# Patient Record
Sex: Female | Born: 1940 | Race: White | Hispanic: No
Health system: Midwestern US, Academic
[De-identification: ages and names within clinical notes are randomized; demographics above are authoritative.]

---

## 2017-12-30 ENCOUNTER — Encounter: Admit: 2017-12-30 | Discharge: 2017-12-31 | Payer: MEDICARE

## 2018-07-30 ENCOUNTER — Encounter: Admit: 2018-07-30 | Discharge: 2018-07-30 | Payer: MEDICARE

## 2018-07-30 DIAGNOSIS — R4189 Other symptoms and signs involving cognitive functions and awareness: ICD-10-CM

## 2018-07-30 DIAGNOSIS — R413 Other amnesia: Principal | ICD-10-CM

## 2018-08-02 ENCOUNTER — Encounter: Admit: 2018-08-02 | Discharge: 2018-08-02 | Payer: MEDICARE

## 2018-08-02 ENCOUNTER — Ambulatory Visit: Admit: 2018-08-02 | Discharge: 2018-08-03

## 2018-08-02 DIAGNOSIS — R4189 Other symptoms and signs involving cognitive functions and awareness: ICD-10-CM

## 2018-08-02 DIAGNOSIS — I6381 Other cerebral infarction due to occlusion or stenosis of small artery: ICD-10-CM

## 2018-08-02 DIAGNOSIS — E7800 Pure hypercholesterolemia, unspecified: ICD-10-CM

## 2018-08-02 DIAGNOSIS — R413 Other amnesia: Principal | ICD-10-CM

## 2018-08-02 DIAGNOSIS — R930 Abnormal findings on diagnostic imaging of skull and head, not elsewhere classified: ICD-10-CM

## 2018-08-02 DIAGNOSIS — G301 Alzheimer's disease with late onset: Principal | ICD-10-CM

## 2018-08-03 ENCOUNTER — Encounter: Admit: 2018-08-03 | Discharge: 2018-08-03 | Payer: MEDICARE

## 2018-08-16 ENCOUNTER — Encounter: Admit: 2018-08-16 | Discharge: 2018-08-16 | Payer: MEDICARE

## 2018-11-16 ENCOUNTER — Encounter: Admit: 2018-11-16 | Discharge: 2018-11-16 | Payer: MEDICARE

## 2018-11-16 MED ORDER — MEMANTINE 10 MG PO TAB
10 mg | ORAL_TABLET | Freq: Two times a day (BID) | ORAL | 1 refills | Status: AC
Start: 2018-11-16 — End: 2019-04-01

## 2019-01-27 ENCOUNTER — Encounter: Admit: 2019-01-27 | Discharge: 2019-01-27 | Payer: MEDICARE

## 2019-01-31 ENCOUNTER — Encounter: Admit: 2019-01-31 | Discharge: 2019-01-31 | Payer: MEDICARE

## 2019-02-02 ENCOUNTER — Encounter: Admit: 2019-02-02 | Discharge: 2019-02-02 | Payer: MEDICARE

## 2019-02-09 ENCOUNTER — Encounter: Admit: 2019-02-09 | Discharge: 2019-02-09 | Payer: MEDICARE

## 2019-02-09 MED ORDER — DONEPEZIL 10 MG PO TAB
10 mg | ORAL_TABLET | Freq: Every evening | ORAL | 1 refills | 90.00000 days | Status: DC
Start: 2019-02-09 — End: 2019-08-03

## 2019-04-01 ENCOUNTER — Encounter: Admit: 2019-04-01 | Discharge: 2019-04-01

## 2019-04-01 MED ORDER — MEMANTINE 10 MG PO TAB
10 mg | ORAL_TABLET | Freq: Two times a day (BID) | ORAL | 0 refills | Status: DC
Start: 2019-04-01 — End: 2019-07-05

## 2019-04-01 NOTE — Telephone Encounter
Patient to continue memantine 10 mg Bid per last office visit on 08/12/18.  Last Rx 11/16/18 #180 with 1 refill.

## 2019-04-06 ENCOUNTER — Encounter: Admit: 2019-04-06 | Discharge: 2019-04-06

## 2019-04-06 ENCOUNTER — Encounter: Admit: 2019-04-06 | Discharge: 2019-04-07

## 2019-04-07 ENCOUNTER — Encounter: Admit: 2019-04-07 | Discharge: 2019-04-07

## 2019-04-13 ENCOUNTER — Encounter: Admit: 2019-04-13 | Discharge: 2019-04-13

## 2019-04-18 ENCOUNTER — Encounter: Admit: 2019-04-18 | Discharge: 2019-04-18

## 2019-04-18 ENCOUNTER — Ambulatory Visit: Admit: 2019-04-18 | Discharge: 2019-04-18

## 2019-04-18 DIAGNOSIS — R4189 Other symptoms and signs involving cognitive functions and awareness: Secondary | ICD-10-CM

## 2019-04-18 DIAGNOSIS — R413 Other amnesia: Secondary | ICD-10-CM

## 2019-04-18 DIAGNOSIS — E7800 Pure hypercholesterolemia, unspecified: Secondary | ICD-10-CM

## 2019-04-18 DIAGNOSIS — R51 Headache: Secondary | ICD-10-CM

## 2019-04-18 DIAGNOSIS — R9082 White matter disease, unspecified: Secondary | ICD-10-CM

## 2019-04-18 DIAGNOSIS — I6381 Other cerebral infarction due to occlusion or stenosis of small artery: Secondary | ICD-10-CM

## 2019-04-18 DIAGNOSIS — G301 Alzheimer's disease with late onset: Secondary | ICD-10-CM

## 2019-04-18 NOTE — Progress Notes
MEMORY CARE CLINIC EVALUATION    Obtained patient's verbal consent to treat them and their agreement to [facility] financial policy and NPP via this telehealth visit during the [facility]    This was a video visit    Referring Provider:    Tildon Husky, PA-C  [address]  [place], [place] [address]    Chief Complaint:  Sabrina Dillon is a 78 y.o. old female here for return evaluation.  History provided by: 2 daughters and patient  Onset: Roughly 2018  Course: Gradual but possible subacute change with the stroke in May    _______________________________________________________________________  _____________________HISTORY__________________________________________  Per Sabrina Dillon - everything changed mostly around the stroke in March. She had imbalance and confusion - could not do chores around her house. It took her about 3 days of this to notify her daughters and they took her to the local ED. 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 She had mild left sided weakness/incoordination that improved with physical therapy.  ???  They reportedly did an echocardiogram and carotid ultrasound. 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 Her LDL was elevated and this was thought to be a possible cause - she was placed on plavix + aspirin for 90 days and atorvastatin.  ???  Overall, she thinks she is back to baseline.  ???  Per her daughters, memory has been an issue for 2-3 years. She was seen by a neurology group in [place]. Sabrina Dillon who suggested that this was early Alzheimer's back around 1 year ago.      INTERVAL HISTORY 04/18/19    She has been relatively stable    Two weeks ago: left face drooping, slurring words, confused with conversations. They brought Sabrina Dillon ED - CT and MRI, they did not see any new strokes. Her symptoms resolved within about 3.5-4 years. Her blood pressure was mildly elevated and then resolved. Then she would fluctuate - and it eventually resolved and they were discharged from the ED with no further medication changes or workup ordered.    She is waking up in the morning with pressure in her head and with headaches.    Memory:  No major changes here. She is getting a little bit more assistance from family at this time.    Motor:  No falls - uses a cane as an extra stabilizer      Cognitive and Functional History (i.e. memory, orientation, judgment, home and hobbies, community affairs, bathing / grooming):    Living arrangements: She lives alone in her house and nearby   Dressing: Independent  Bathing: Independent  Toileting: Independent  Eating:  Independent  Using a phone or land line: Independent  Usual household chores:  Independent  Meal Preparation:  Requires assistance - senior citizens deliver meals and she can cook   Medication administration: Requires assistance - granddaughter/daughter sets up the pills in a pill box and she takes them on her own.  Managing finances: Requires assistance - granddaughter.     No longer driving.  ???  Behavioral and Neuropsychiatric History (i.e., depression, agitation, behavior / personality): PHQ-2 Score: 2     No major concerns.  ???  ???  Other ROS  Visual hallucinations: no    Parkinsonism / tremors: no    Gait Changes: no  Anosmia: no  Orthostatic sx: no  Vision problems: no  Wt loss: no  Sleep issues: no   Incontinence:no  A comprehensive 14 point review of systems was negative unless otherwise mentioned in the HPI.  ???  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 Denies heavy alcohol and illicit substance use.  ???  Family hx:  No known family h/o dementia or similar sx's.  ???  Caregiver Burden and Social Support:  Good social support.  ???  Safety Assessment (driving, falls, etc):  Not sure on falls.   ???  Advanced Care Planning  DPOA: Yes  Advance Directive???????Yes ____________________________________________________________________  ____________________MEDICATIONS_____________________________________  ??? aspirin 81 mg chewable tablet Chew 81 mg by mouth daily. Take with food.   ??? atorvastatin (LIPITOR) 40 mg tablet Take 40 mg by mouth daily.   ??? donepeziL (ARICEPT) 10 mg tablet Take one tablet by mouth at bedtime daily.   ??? escitalopram oxalate (LEXAPRO) 20 mg tablet Take 20 mg by mouth daily.   ??? memantine (NAMENDA) 10 mg tablet Take one tablet by mouth twice daily.       High Risk Medication Review:     Relevant Medical History / Contributing Factors to Cognition:  None  ______________________________________________________________________________  ___________________PHYSICAL AND COGNITIVE EXAM______________________  Vital Signs: Pulse 84 Comment: Per daughter/Sabrina Dillon - Wt 57.6 kg (127 lb) Comment: Patient recalls - BMI 21.80 kg/m???     This was a phone visit and no exam was performed.    Labs / Imaging  No results found for: TSH  No results found for: VITB12  No results found for this or any previous visit.    __________________________________________________________________  _______________DIAGNOSIS AND PLAN_______________________________    DIAGNOSIS:      ICD-9-CM ICD-10-CM    1. Late onset Alzheimer's disease without behavioral disturbance (HCC) 331.0 G30.1     294.10 F02.80    2. 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 White matter abnormality on MRI of brain 793.0 R90.82    4. Hypercholesterolemia 272.0 E78.00    5. Morning headache 784.0 R51         Stage: mild dementia   CDR = 1.0       Fortunately, overall Sabrina Dillon appears stable per the history. As noted previously she has mild dementia with the probable etiology due to early Alzheimer's disease + vascular disease contribution. The stability is short term at this point but could indicate more of a vascular component. There are three possibilities for the event that occurred 2 weeks ago:    1) An amyloid spell: where blood vessels that are damaged from amyloid angiopathy leak blood out which is irritating to the brain tissue and can cause transient neurologic symptoms. As described earlier an MRI with special SWI/SWAN sequences would be ideal to look at this but one with GRE sequences can sometimes suffice and they may have done that on this MRI. We will have it requested to upload to our system so this can be reviewed.    2) A TIA - transient ischemic attack - a new near stroke that was basically what we refer to as a stuttering lacune - a term for a small lacunar stroke that fluctuates due to the blood vessel being very tenuous. Her prior stroke was similar in size/location to what we see in lacunar strokes. These types are usually due to the vascular risk factors: aging and genetics but more importantly ones we can control: smoking, high blood  pressure, high cholesterol, diabetes, sleep apnea, and migraines. Tight blood pressure control < 120 mmHg systolic is recommended going forwards.    3) Her prior stroke was on that right side and it could have flared up again or even had a mini seizure from that old damaged brain tissue - I think this one is less likely but if the MRI truly does not have any new lesions this remains a possibility.    To look into this incident further I would like to:    1) Get access and review her MRI scan from 2 weeks ago  2) Request her PCP order a new carotid ultrasound and have the report sent to me  3) Request her PCP order a 24-hour blood pressure cuff. This is partly to look into whether her blood pressure is spiking at night time. She wakes up with headaches more frequently now and two potential causes are sleep apnea and/or elevated blood pressure at night time. This test may help [place] adjust her BP mediations for the blood pressure goals suggested above. The carotid ultrasound should probably be rechecked given the previous TIA.    24-hour blood pressure       I spent a total of 30 minutes with the patient;  25 minutes were spent in counseling and coordination of care as noted above.      Medical decision making: moderate to severe    How to Keep Your Brain Healthy   1. Physical exercise: Physical exercise allows more blood flow to get to your brain.  All exercise is better than none, but gardening and walking are like tier 1 of exercise and will not get your heart rate higher than 100 in many instances. Weight training, yoga, and resistance bands are like tier 2 level. We see the real benefits at tier 3 level of exercise, which involves aerobic or cardiovascular exercise. Cardio exercise involves getting your heart rate up and a real sweat going.  This level of exercise can release BDNF (brain derived neurotrophic factor) which helps stabilize neurons - we think of it as fertilizer for those brain cells.  Research studies suggest symptoms progress slower and brain atrophy slows in cardio intensive exercise groups.  This can be done with high intensity workouts, running, biking, stationary biking, rowing machine, or in a swimming pool.  Talk to your health care provider before doing strenuous exercise. The ultimate goal is to get 150 minutes of moderate to intense exercise each week, most easily divided up into 30 minutes per day 5 days per week. It is recommended that one eases into this and slowly increases their amount of exercise over weeks to months. If you have never exercised before or are in poor exercise shape, it is recommended that you seek a personal trainer or join a local gym with programs directed for your age/exercise level.    2. Diet: Eating a heart-healthy diet may help protect brain function. Further information about the Mediterranean/MIND diet is listed below.  Talk to your primary medical provider about your specific diet needs. 3. Sleep: The toxic proteins that we all build up during the day and are associated with neurodegeneration of neurons are likely only cleared during deep, slow wave stages of sleep. If deep sleep is disrupted, fewer proteins are cleared. Sleep is also important for memory consolidation and restorative aspects to help with attention of tasks throughout the next day.  The average person requires 7-9 hours of quality sleep per night.  Try  to maintain a sleep routine.  Avoid caffeine in the afternoon and heavy meals after 7 pm.  Talk to your local medical provider if you are having problems with sleeping (for example: insomnia, obstructive sleep apnea, excessive daytime sleepiness, or not sleeping enough).    4. Mental exercise: Push your brain with new things and new places.  Take a community education class, or attend a lecture at [employer]. Doing activities, such as reading, learning an instrument or a new language is healthy for our brains. Utilize cognitive training apps or programs. There is no evidence currently that one works better than other, but make sure not to focus on one thing (crosswords, Sudoku puzzles, computer-based games, etc.)  Make sure to have a broad cognitive program.  5. Stress Reduction: Simplify your life as much as possible. Meditate, do yoga or tai chi, or perform deep breathing, etc.  Stress will amplify any underlying problem.    6. Spiritual Fitness: Find something you are passionate about and that gives your life meaning and purpose each day.  Get involved.  Stay active in your local community and with social functions.    Mediterranean Diet   The Mediterranean Diet and the MIND diet are the best studied across neurodegenerative diseases.  Both have shown a reduced risk of Alzheimer's disease across multiple studies, ranging from 35-50% reduced risk.  The Mediterranean diet is considered to be a heart-healthy diet as well. Interested in trying the Mediterranean diet? These tips will help you get started:  ? Eat more fruits and vegetables. Aim for 7 to 10 servings a day of fruit and vegetables.  ? Opt for whole grains. Switch to whole-grain bread, cereal and pasta. Experiment with other whole grains, such as bulgur and farro.  ? Use healthy fats. Try olive oil as a replacement for butter when cooking. Instead of putting butter or margarine on bread, try dipping it in flavored olive oil.  ? Eat more seafood. Eat fish twice a week. Fresh or water-packed tuna, salmon, trout, mackerel and herring are healthy choices. Grilled fish tastes good and requires little cleanup. Avoid deep-fried fish.  ? Reduce red meat. Substitute fish, poultry or beans for meat. If you eat meat, make sure it's lean and keep portions small.  ? Enjoy some dairy. Eat low-fat [place] or plain yogurt and small amounts of a variety of cheeses.  ? Spice it up. Herbs and spices boost flavor and lessen the need for salt.    Utilize a high rated book on the subject or a good S[employer]itor as well: https://www.helpguide.org/articles/diets/the-mediterranean-diet.htm

## 2019-04-19 ENCOUNTER — Encounter: Admit: 2019-04-19 | Discharge: 2019-04-19

## 2019-04-19 DIAGNOSIS — R4189 Other symptoms and signs involving cognitive functions and awareness: Secondary | ICD-10-CM

## 2019-04-19 DIAGNOSIS — R413 Other amnesia: Secondary | ICD-10-CM

## 2019-05-18 ENCOUNTER — Encounter: Admit: 2019-05-18 | Discharge: 2019-05-18

## 2019-05-18 NOTE — Telephone Encounter
RN sent request over for MRI and CT images done at [facility]. To be clouded to [employer] and reports to be faxed to [phone].

## 2019-05-18 NOTE — Telephone Encounter
-----   Message from Leighton Ruff, MD sent at 04/19/2019  7:51 AM CDT -----  Patient had an MRI and CT done 2 weeks ago at [facility] emergency room.    Can we please request that via coud and I get notified when it is available to review?    Thank you,    Thurmond Butts

## 2019-05-19 ENCOUNTER — Encounter: Admit: 2019-05-19 | Discharge: 2019-05-19

## 2019-05-19 NOTE — Telephone Encounter
E-mail sent to [facility] asking that they request the patient's MRI head images done in the past month to be clouded from [facility].

## 2019-05-20 ENCOUNTER — Encounter: Admit: 2019-05-20 | Discharge: 2019-05-20

## 2019-05-20 NOTE — Telephone Encounter
Received notification from [facility] that they don't have any records for the patient.  Images were re-requested from [facility] in [place], [place].  CT head w/o and MRI brain w/o reports and images from 04/06/19 were received.

## 2019-05-24 IMAGING — CT SINUS_(Adult)
3 of 4 series · 15 of 47 positions shown, 18 images · non-contrast
Comparison: none

[Series 2: sinus ax 3.00 hr60 s3 · axial · 0.29mm/px · z∈[-523,-416]mm · 9 of 43 slices shown, 12 images]
[im 4/43  brain]
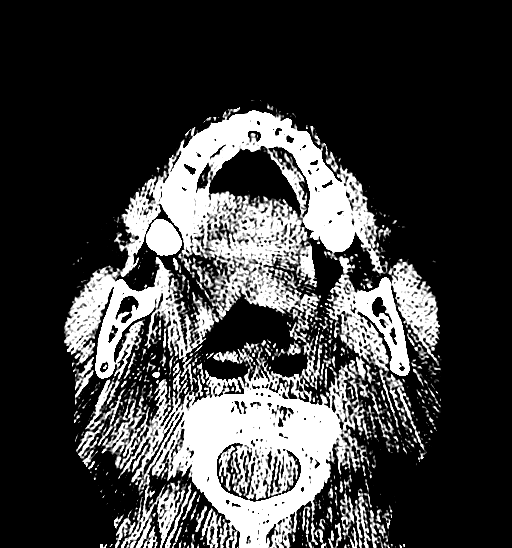
[im 4/43  bone]
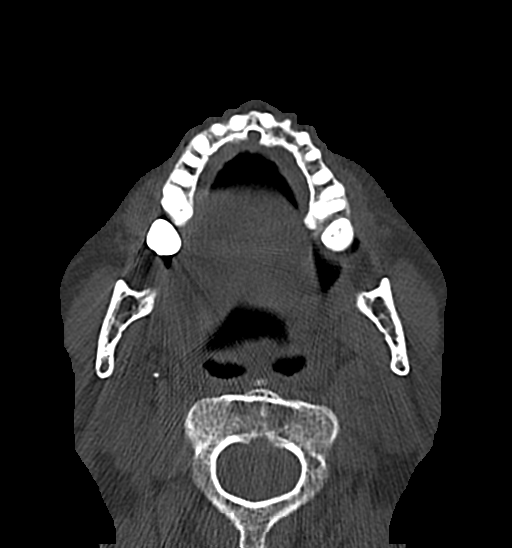
[im 10/43  bone]
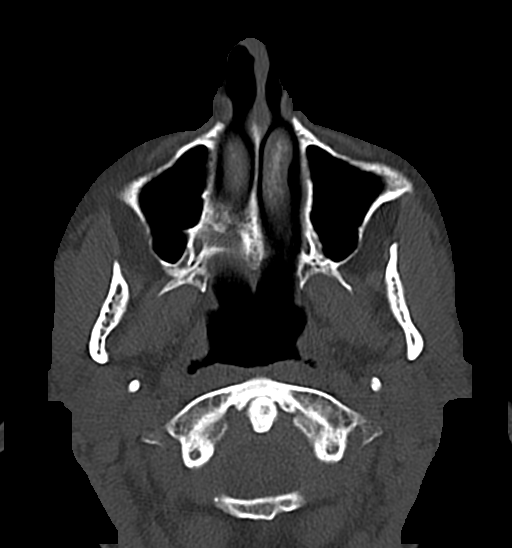
[im 13/43  bone]
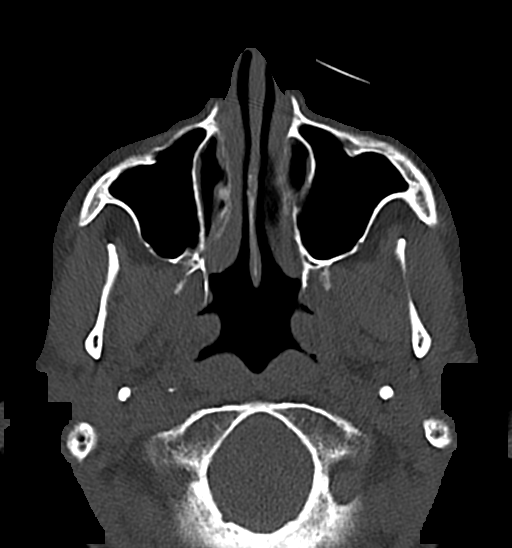
[im 19/43  bone]
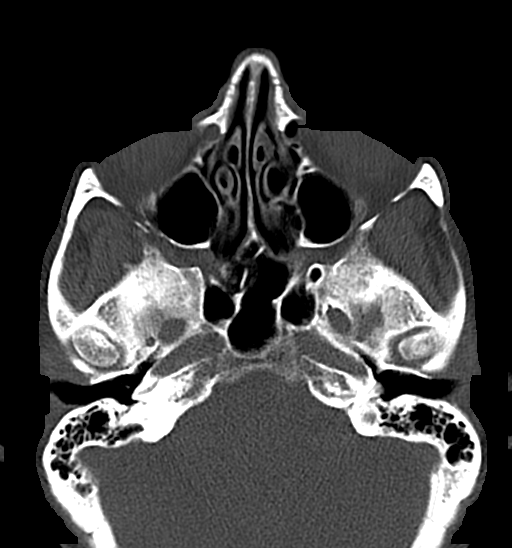
[im 22/43  brain]
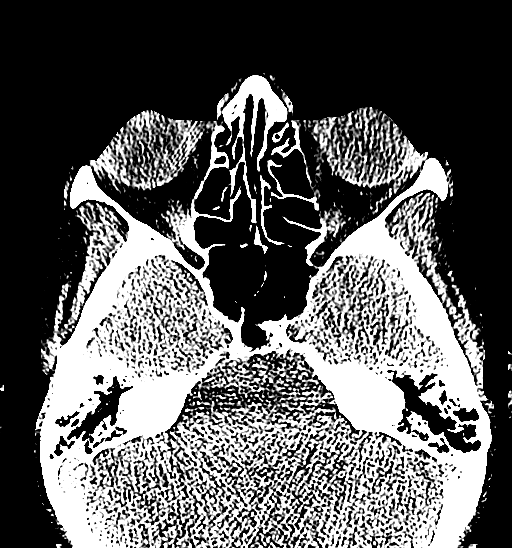
[im 22/43  bone]
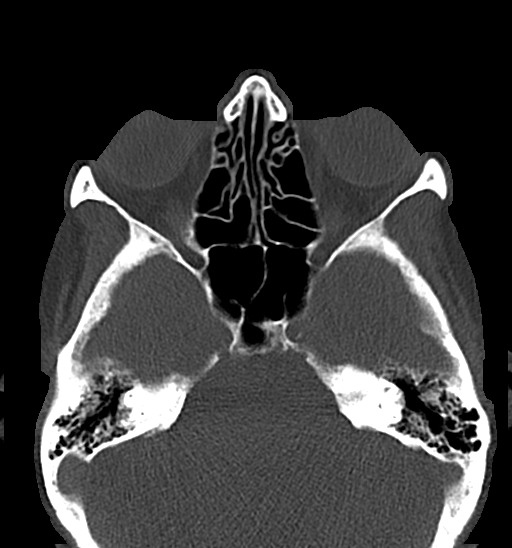
[im 25/43  bone]
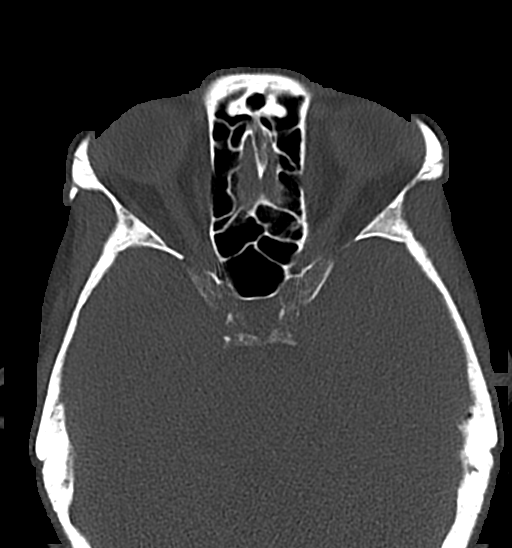
[im 31/43  bone]
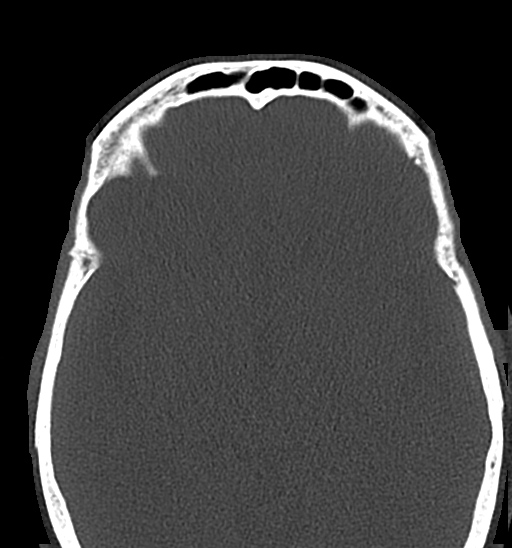
[im 34/43  bone]
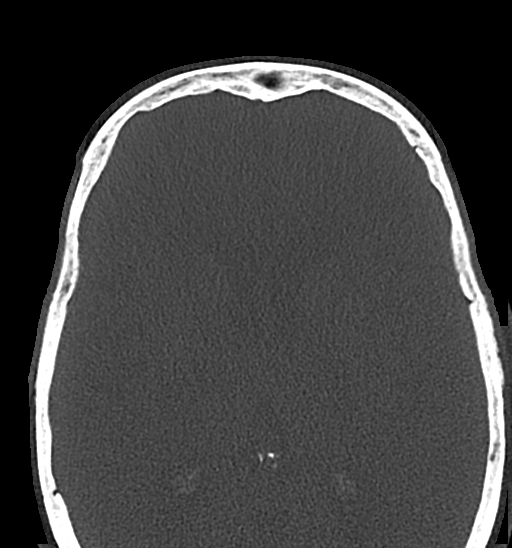
[im 40/43  brain]
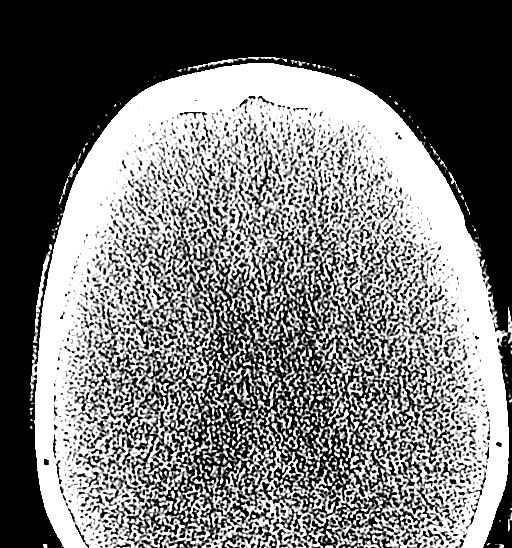
[im 40/43  bone]
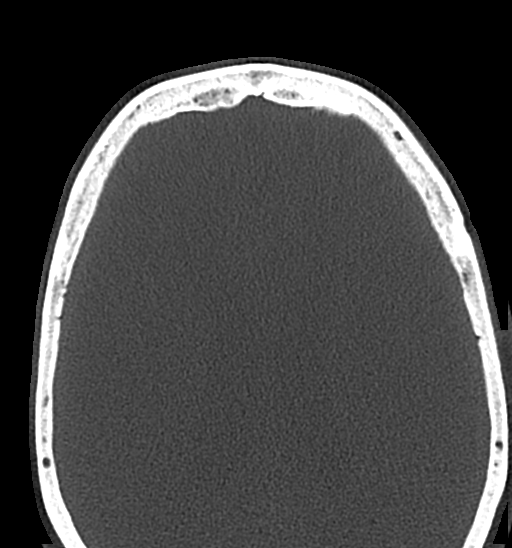

[Series 4: sinus cor 3.00 hr60 s3 · coronal · 0.26mm/px · 3 of 52 slices shown]
[im 18/52  bone]
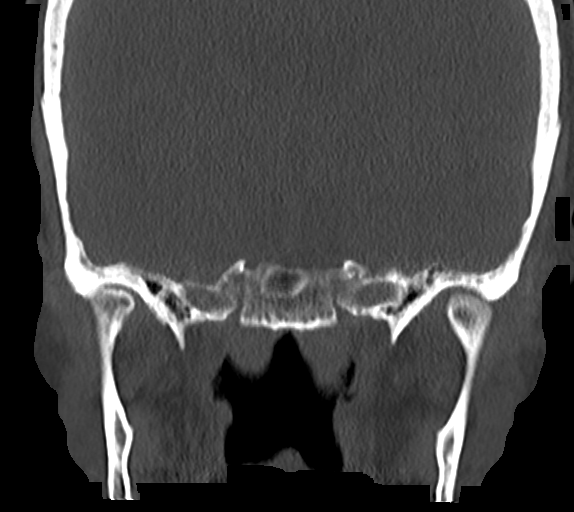
[im 23/52  bone]
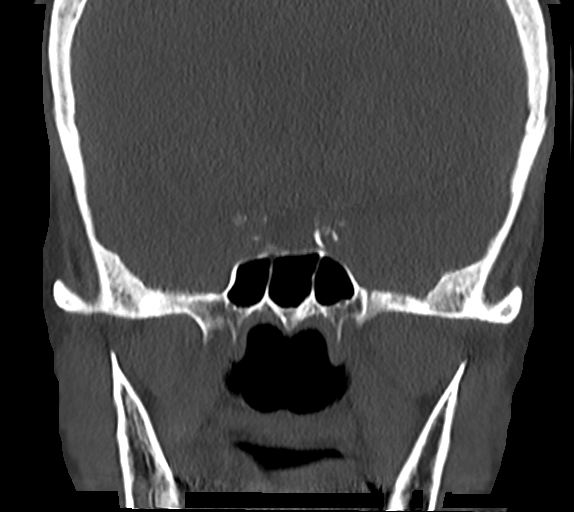
[im 29/52  bone]
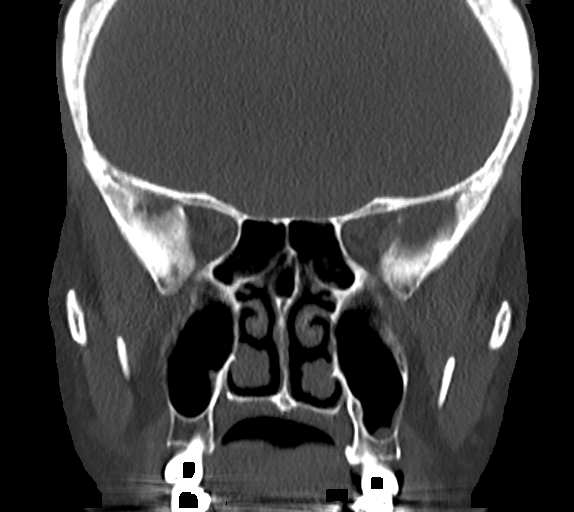

[Series 6: sinus sag 3.00 hr60 s3 · sagittal · 0.26mm/px · 3 of 49 slices shown]
[im 17/49  bone]
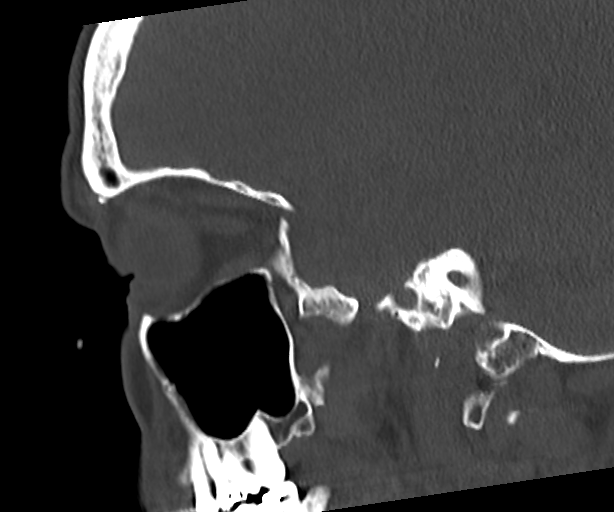
[im 25/49  bone]
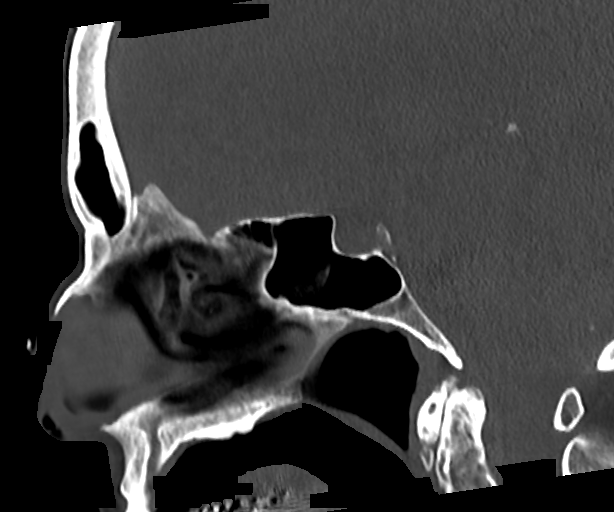
[im 33/49  bone]
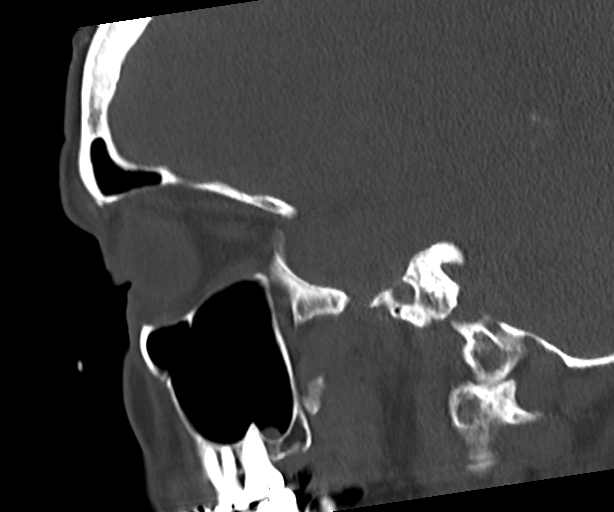

[15 of 47 positions shown; findings below may reference images not displayed]

DIAGNOSTIC STUDIES

EXAM
CT PARANASAL SINUSES WITHOUT CONTRAST

INDICATION
chronic recurrent sinusitis
chronic recurrent sinusitis

TECHNIQUE
Contiguous transaxial imaging through the paranasal sinuses was performed in the absence of
intravenous iodinated contrast material. Coronal and sagittal reformations were obtained from the
transaxial source data and created by the CT technologist at the workstation.

All CT scans at this facility use dose modulation, iterative reconstruction, and/or weight based
dosing when appropriate to reduce radiation dose to as low as reasonably achievable.

COMPARISONS
None

Number of previous computed tomography exams in the last 12 months is0 .
Number of previous nuclear medicine myocardial perfusion studies in the last 12 months is0 .

FINDINGS
The paranasal sinuses, mastoid air cells, and middle ear cavities are clear. The nasal cavity and
nasopharynx are clear. There is no soft tissue mass or fluid collection. No facial bone fracture is
identified. The globes and orbits are unremarkable. Limited imaging of the inferior aspect of the
brain is without midline shift, mass effect or ventriculomegaly. The ostiomeatal units are clear.

IMPRESSION
Clear paranasal sinuses and no evidence of nasal cavity or nasopharyngeal mass.

Tech Notes:

chronic recurrent sinusitis

## 2019-07-05 ENCOUNTER — Encounter: Admit: 2019-07-05 | Discharge: 2019-07-05

## 2019-07-05 MED ORDER — MEMANTINE 10 MG PO TAB
10 mg | ORAL_TABLET | Freq: Two times a day (BID) | ORAL | 0 refills | Status: DC
Start: 2019-07-05 — End: 2019-10-04

## 2019-08-03 MED ORDER — DONEPEZIL 10 MG PO TAB
10 mg | ORAL_TABLET | Freq: Every evening | ORAL | 1 refills | 90.00000 days | Status: DC
Start: 2019-08-03 — End: 2020-01-27

## 2019-08-03 NOTE — Telephone Encounter
Patient last seen on 04/18/19 by Dr. Minerva Ends via Telehealth.  Last Rx refill 02/09/19 #90 with 1 additional refill.  Next visit scheduled for 10/14/19.

## 2019-08-14 IMAGING — US CARDUPBI
1 series · 14 of 16 positions shown · non-contrast
Comparison: none

[Series 1: us carotid duplex bi · 14 of 66 slices shown]
[im 1/66]
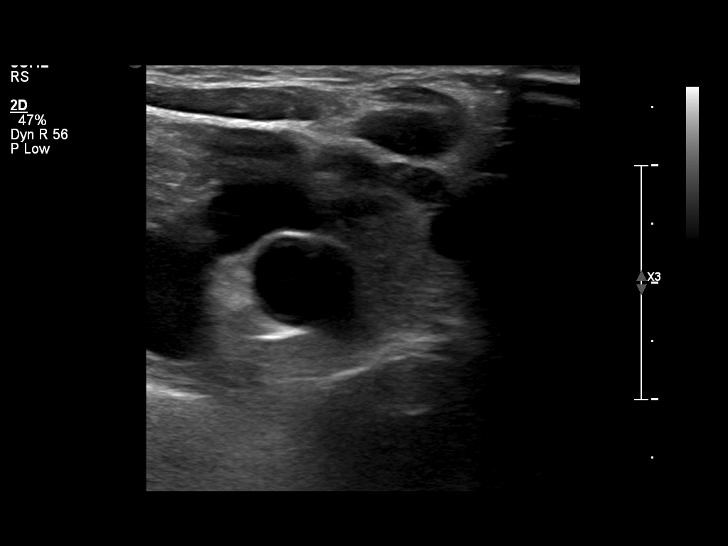
[im 5/66]
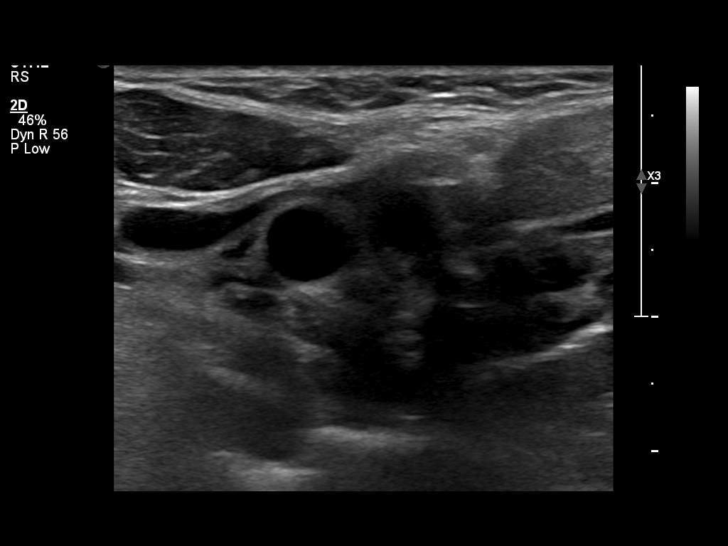
[im 9/66]
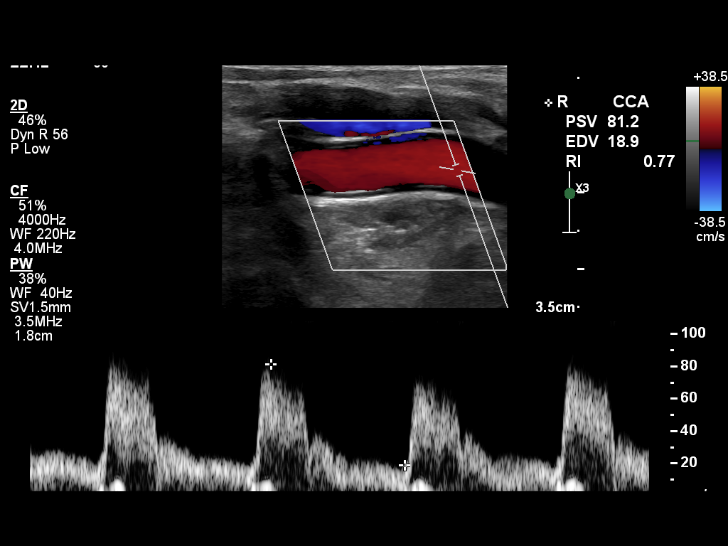
[im 18/66]
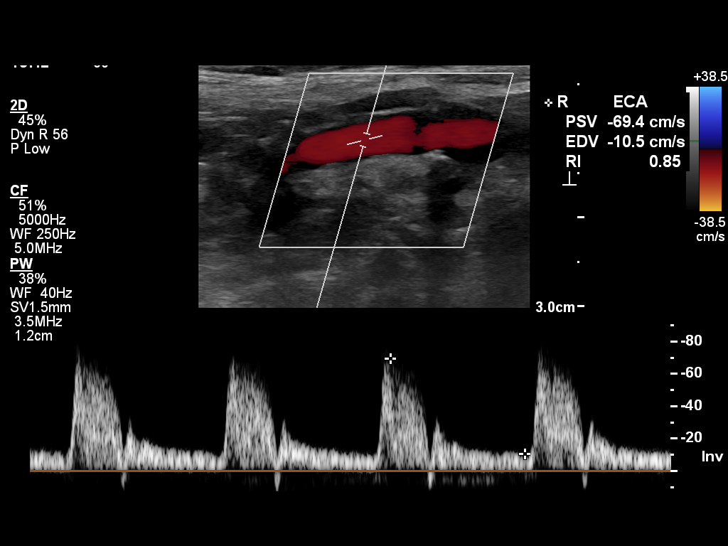
[im 22/66]
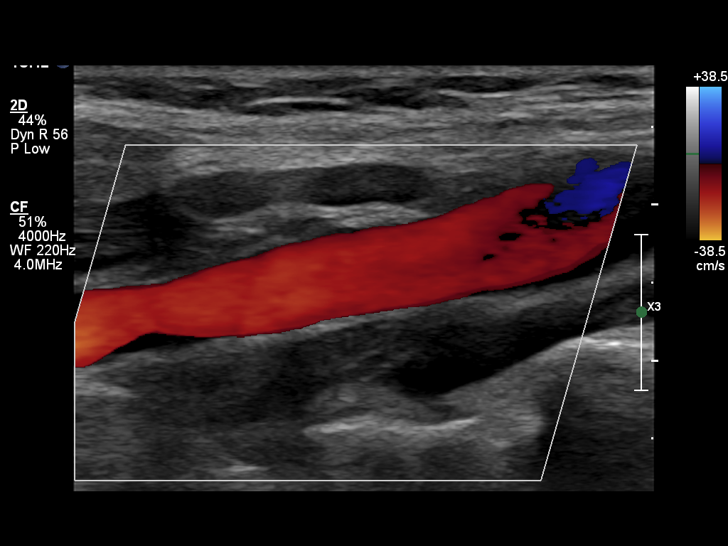
[im 27/66]
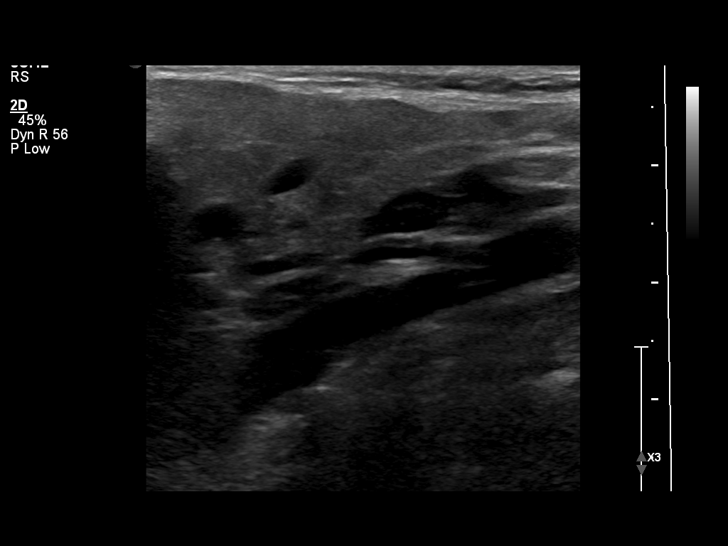
[im 31/66]
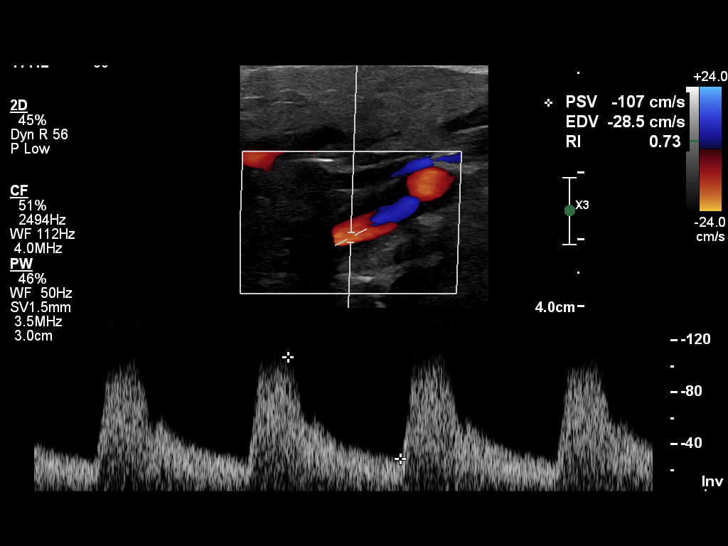
[im 35/66]
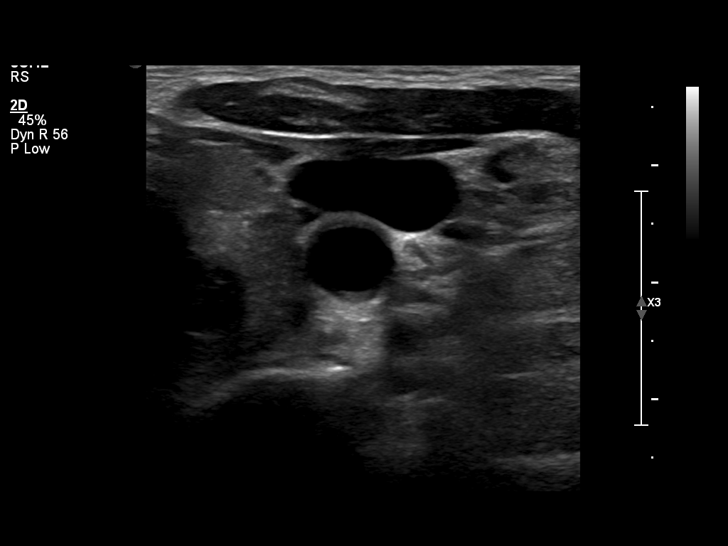
[im 40/66]
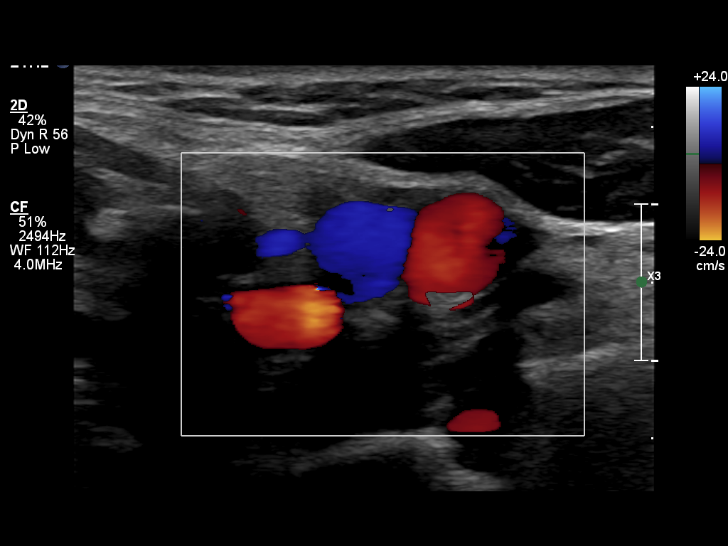
[im 44/66]
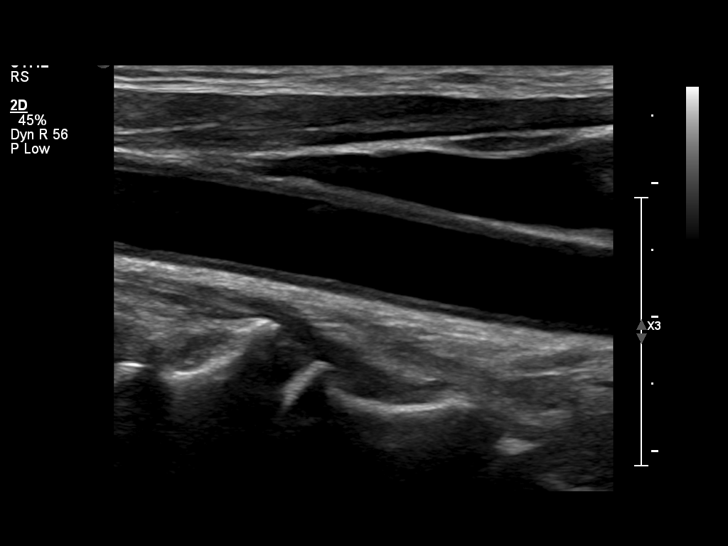
[im 53/66]
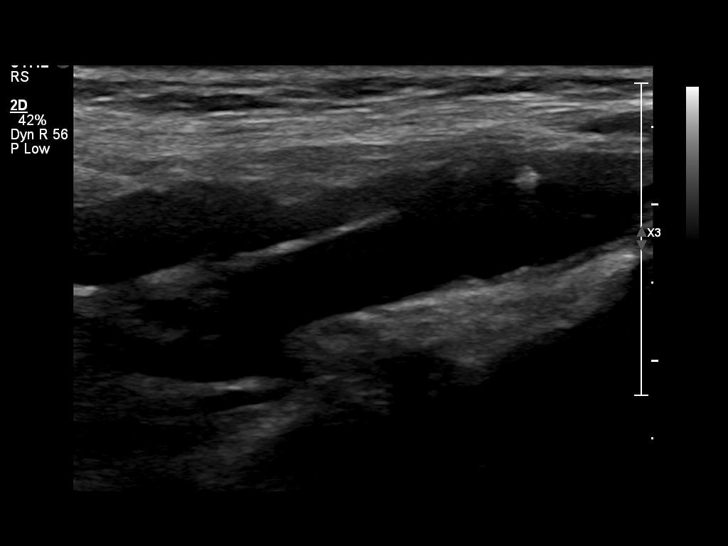
[im 57/66]
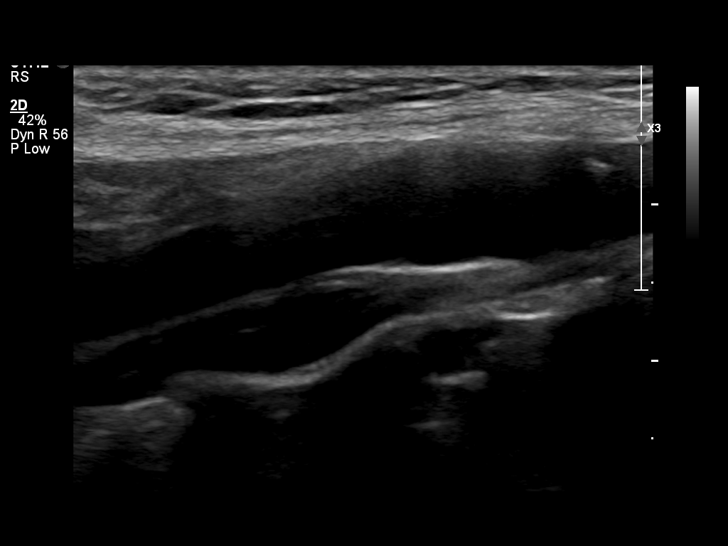
[im 61/66]
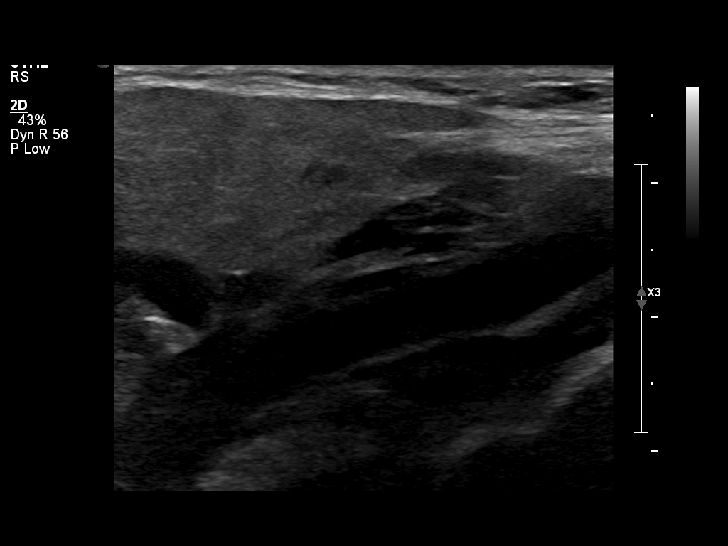
[im 66/66]
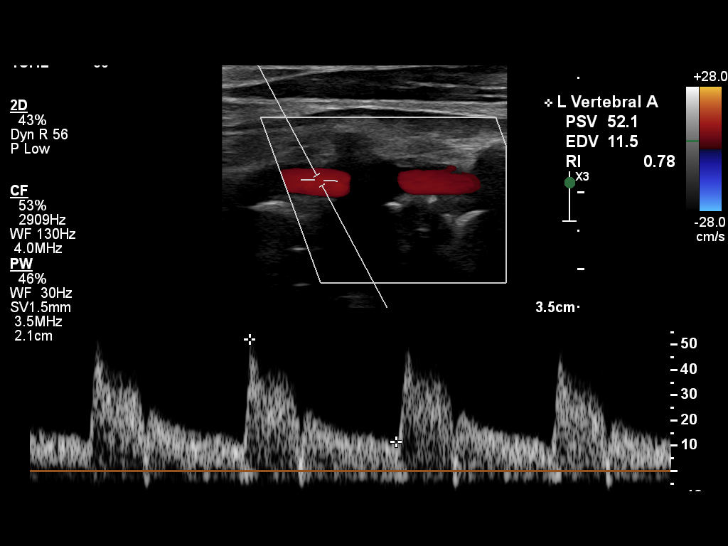

[14 of 16 positions shown; findings below may reference images not displayed]

EXAM
DUPLEX SCAN OF EXTRACRANIAL ARTERIES; COMPLETE BILATERAL STUDY, CPT 27888

INDICATION
FREQUENT HEADACHES, TIA IN December 2018

TECHNIQUE
Multiple static grayscale and color Doppler ultrasound images are provided for a bilateral carotid
ultrasound.

COMPARISONS
There are no previous examinations available for comparison at the time of dictation.

FINDINGS
RIGHT CAROTID SYSTEM: There is mild calcified plaque in the right carotid bulb. Common carotid
artery velocity is 73cm/sec.
Internal carotid artery peak systolic velocity is 134cm/sec
with end-diastolic velocity of 49cm/sec.
ICA/CCA ratio is 1.84.
LEFT CAROTID SYSTEM: Mild to moderate calcified plaque is seen in the left carotid bulb. Common
carotid artery velocity is 78cm/sec.
Internal carotid artery peak systolic velocity is 115cm/sec
with end-diastolic velocity of 83cm/sec.
ICA/CCA ratio is 1.5.
Bilateral vertebral artery flow is antegrade.

IMPRESSION
Bilateral calcified plaque in the carotid bulbs left-greater-than-right. By velocity criteria there
is a 50-69 percent degree of stenosis in the right internal carotid artery along its distal aspect.

Tech Notes:

FREQUENT HEADACHES, TIA IN December 2018

## 2019-09-07 ENCOUNTER — Encounter: Admit: 2019-09-07 | Discharge: 2019-09-07 | Payer: MEDICARE

## 2019-10-04 ENCOUNTER — Encounter: Admit: 2019-10-04 | Discharge: 2019-10-04 | Payer: MEDICARE

## 2019-10-04 MED ORDER — MEMANTINE 10 MG PO TAB
10 mg | ORAL_TABLET | Freq: Two times a day (BID) | ORAL | 0 refills | Status: DC
Start: 2019-10-04 — End: 2020-01-03

## 2019-10-05 ENCOUNTER — Encounter: Admit: 2019-10-05 | Discharge: 2019-10-05 | Payer: MEDICARE

## 2019-10-13 ENCOUNTER — Encounter: Admit: 2019-10-13 | Discharge: 2019-10-13 | Payer: MEDICARE

## 2019-10-14 ENCOUNTER — Encounter: Admit: 2019-10-14 | Discharge: 2019-10-14 | Payer: MEDICARE

## 2019-10-14 DIAGNOSIS — G301 Alzheimer's disease with late onset: Secondary | ICD-10-CM

## 2019-10-14 DIAGNOSIS — R4189 Other symptoms and signs involving cognitive functions and awareness: Secondary | ICD-10-CM

## 2019-10-14 DIAGNOSIS — R519 Morning headache: Secondary | ICD-10-CM

## 2019-10-14 DIAGNOSIS — E7800 Pure hypercholesterolemia, unspecified: Secondary | ICD-10-CM

## 2019-10-14 DIAGNOSIS — I6381 Other cerebral infarction due to occlusion or stenosis of small artery: Secondary | ICD-10-CM

## 2019-10-14 DIAGNOSIS — R9082 White matter disease, unspecified: Secondary | ICD-10-CM

## 2019-10-14 DIAGNOSIS — G4733 Obstructive sleep apnea (adult) (pediatric): Secondary | ICD-10-CM

## 2019-10-14 DIAGNOSIS — R413 Other amnesia: Secondary | ICD-10-CM

## 2019-10-14 NOTE — Progress Notes
Obtained patient's verbal consent to treat them and their agreement to [facility] financial policy and NPP via this telehealth visit during the [facility]    This was a phone visit    Chief Complaint:  Sabrina Dillon is a 78 y.o. old female here for follow up from our previous visit on 04/18/19    Subjective:    Since I last saw Sabrina Dillon, things are about the same. She still lives at home, with family just nearby. They have set up a pill box which is automated and has an alarm when it is time to take the medications that is working very well. She goes to the college gym/track 3 days per week. Another friend picks her up and she goes through hour long strengthening/exercising. They get out and shop and do things. Blood pressure is doing pretty well other than the spikes at night time they saw during the sleep testing. I had asked around here about ambulatory blood pressure cuffs to check night time blood pressure readings and they were not available here from everyone I talked to. It must be more of a specialized thing at institutions like [facility].    Sleep: She thinks she is not getting enough sleep. She had her sleep study and had an AHI of 42 per hour. Her blood pressure and heart rate were increased.     Objective:    ____________________MEDICATIONS_____________________________________  ? aspirin 81 mg chewable tablet Chew 81 mg by mouth daily. Take with food.   ? atorvastatin (LIPITOR) 40 mg tablet Take 40 mg by mouth daily.   ? cetirizine (ZYRTEC) 10 mg tablet Take 10 mg by mouth every morning.   ? donepeziL (ARICEPT) 10 mg tablet Take one tablet by mouth at bedtime daily. (Patient taking differently: Take 10 mg by mouth daily after breakfast.)   ? escitalopram oxalate (LEXAPRO) 20 mg tablet Take 20 mg by mouth daily.   ? fluticasone propionate (FLONASE) 50 mcg/actuation nasal spray, suspension Apply  to each nostril as directed daily. Shake bottle gently before using. ? memantine (NAMENDA) 10 mg tablet Take one tablet by mouth twice daily.       High Risk Medication Review: None    Relevant Medical History / Contributing Factors to Cognition:  None  ______________________________________________________________________________  ___________________PHYSICAL AND COGNITIVE EXAM______________________  Vital Signs:   Telehealth Patient Reported Vitals     Row Name 10/14/19 0837                BP:  120/80 per daughters recall        Pulse:  80 per daughters recall        Weight:  55.3 kg (122 lb) per daughters recall        Pain Score:  Zero            Mental Status:    Alert and conversant  8 point Orientation as below  Speech is fluent with intact comprehension, naming and repetition.    NEUROBEHAVIORAL TESTING    Short Test of Mental Status    (Name, address, current location, current city, current state, year, month, day of the week)  Orientation: 8/8    (2-9-6-8-3, 5-7-1-9-4-6, 2-1-5-9-3-6-2)  Attention: 5/7    apple, Sabrina Dillon, charity, tunnel.   Immediate Recall: 4/4 (number of trials: 2, -1)    5 x 13; 65 - 7; 58/2; 29 + 11  Calculations: 1/4    5 x 13 = 25?  65 -  7 = 54  58/2 = 34     Similarities: orange/banana, dog/horse, table/bookcase  Abstraction: 3/3    Cube: 0/0    Draw clock face showing 10 after 11  Clock: 0/0    Economist; first Economist; define an [place]; number of weeks per year  Information: 3/4     Not sure weeks in a year - consistent with her last testing.    The four words: apple, Sabrina Dillon, charity, tunnel  Delayed Recall: 3/4    Total Score: 26/34 Last time I tested her in October 2019 and she scored 26 out of 38. Of note, on the phone visit I cannot do cube and clock drawing which make up for points of the test. She struggled significantly with these last time and she missed 3 points on those. So if I carried forward exactly what she did last time she would score in the 27 out of 38 range. The differences in her testing between then and now is more difficulties with calculations on this test and less difficulties with delayed recall memory. It was actually quite impressive how she did on the delayed recall.    Assessment/plan:      ICD-9-CM ICD-10-CM    1. Late onset Alzheimer's disease without behavioral disturbance (HCC)  331.0 G30.1     294.10 F02.80    2. Obstructive sleep apnea syndrome  327.23 G47.33    3. 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 White matter abnormality on MRI of brain  793.0 R90.82    5. Hypercholesterolemia  272.0 E78.00    6. Morning headache  784.0 R51.9         Medication:  She should continue donepezil in the morning at 10 mg and Namenda twice daily.  We could consider increasing the donepezil to twice daily dosing to see if it helps further at the follow up visit but with many sleep variables changing below I would like to focus on those first.    Sleep:  She has severe sleep apnea with an AHI of 42 and is going to get fitted for her CPAP machine next Tuesday. I strongly encouraged her to try her best to find something that will work for her and to use her CPAP machine regularly. This can help with her morning headaches, the spikes in blood pressure throughout the night, and her overall cognition.     For difficulties getting to sleep, sleep apnea machines likely will not help with that problem. Therefore I would recommend starting melatonin. Melatonin titration should be over at least 4-6 weeks before ruling it unsuccessful. A fast titration would look as follows: 1st 5 nights: 3 mg tablets, 1 tablet each night at least 1 hour before desired bedtime.  2nd 5 nights: Increase to 6 mg  3rd 5 nights: Increase to 9 mg  4th 5 nights: Increase to 12 mg  5th 5 nights: Increase to 15 mg     Most patients respond to 3-9 mg but if you feel like there are still issues with initiating or staying asleep I would recommend increasing by 3 mg as above. You can always pause for longer than 5 nights to see if a certain dose is adequate.    If melatonin is not enough then I would recommend adding a stronger prescription medication that is not harmful to memory and thinking called trazodone.  They can be in touch with me over the next  few weeks to months of how she is doing with sleep and we can consider adding this before her follow-up appointment.    Driving:  I am concerned with driving in this situation. The bedside cognitive testing indicates a score where driving should likely be limited or eliminated entirely. There is no clear cut off on this test of when it is not safe to drive. However, symptoms will get progressively worse with time and it is not a matter of IF but WHEN it is time to stop driving. We want to maximize independence but as safely as possible. The recommendation therefore, is to pursue an on the road driving evaluation that would help determine if safe for certain areas of driving, certain times of day, or if it is not safe to drive. This is the gold standard for trying to determine when it is no longer safe. Also, from a liability standpoint, there are legal/monetary reasons to consider not driving when it is unclear if it is safe to do so. Passing a driving evaluation would help from a liability standpoint as well. They are understanding of this reasoning. If they wanted to do a driving evaluation I would recommend contacting Lysle Dingwall at:  [employer]:  [phone]  [address]   [place], [place] [address]    Follow-up in 6 months: My main role in this clinic is to try to establish a clear diagnosis and a plan in place moving forwards.  We take a team approach at the [facility] and this involves working with our cognitive care network team and our nurse practitioners very closely.  Because of my focus on trying to see new patients to achieve accurate diagnoses, my availability for follow-ups is limited.  Therefore, now that we have helped establish a diagnosis and have a plan in place, the 78-month follow-up will be with one of our excellent nurse practitioners. I am still readily available for questions and will be actively involved in their care.      How to Keep Your Brain Healthy 1. Physical exercise: Physical exercise allows more blood flow to get to your brain.  All exercise is better than none, but gardening and walking are like tier 1 of exercise and will not get your heart rate higher than 100 in many instances. Weight training, yoga, and resistance bands are like tier 2 level. We see the real benefits at tier 3 level of exercise, which involves aerobic or cardiovascular exercise. Cardio exercise involves getting your heart rate up and a real sweat going.  This level of exercise can release BDNF (brain derived neurotrophic factor) which helps stabilize neurons - we think of it as fertilizer for those brain cells.  Research studies suggest symptoms progress slower and brain atrophy slows in cardio intensive exercise groups.  This can be done with high intensity workouts, running, biking, stationary biking, rowing machine, or in a swimming pool.  Talk to your health care provider before doing strenuous exercise. The ultimate goal is to get 150 minutes of moderate to intense exercise each week, most easily divided up into 30 minutes per day 5 days per week. It is recommended that one eases into this and slowly increases their amount of exercise over weeks to months. If you have never exercised before or are in poor exercise shape, it is recommended that you seek a personal trainer or join a local gym with programs directed for your age/exercise level.    2.  Diet: Eating a heart-healthy diet may help protect brain function. Further information about the Mediterranean/MIND diet is listed below.  Talk to your primary medical provider about your specific diet needs. 3. Sleep: The toxic proteins that we all build up during the day and are associated with neurodegeneration of neurons are likely only cleared during deep, slow wave stages of sleep. If deep sleep is disrupted, fewer proteins are cleared. Sleep is also important for memory consolidation and restorative aspects to help with attention of tasks throughout the next day.  The average person requires 7-9 hours of quality sleep per night.  Try to maintain a sleep routine.  Avoid caffeine in the afternoon and heavy meals after 7 pm.  Talk to your local medical provider if you are having problems with sleeping (for example: insomnia, obstructive sleep apnea, excessive daytime sleepiness, or not sleeping enough).    4. Mental exercise: Push your brain with new things and new places.  Take a community education class, or attend a lecture at [employer]. Doing activities, such as reading, learning an instrument or a new language is healthy for our brains. Utilize cognitive training apps or programs. There is no evidence currently that one works better than other, but make sure not to focus on one thing (crosswords, Sudoku puzzles, computer-based games, etc.)  Make sure to have a broad cognitive program.    5. Stress Reduction: Simplify your life as much as possible. Meditate, do yoga or tai chi, or perform deep breathing, etc.  Stress will amplify any underlying problem.      6. Spiritual Fitness: Find something you are passionate about and that gives your life meaning and purpose each day.  Get involved.  Stay active in your local community and with social functions.    7. Getting involved in research: This can be empowering for many who are involved in research. It gives individuals a sense of purpose and fighting back against this disease. The first person who will be cured of a neurodegenerative disease will be from a research study and helping [place] move that science forward is a major contribution.    Mediterranean Diet The Mediterranean Diet and the MIND diet are the best studied across neurodegenerative diseases.  Both have shown a reduced risk of Alzheimer's disease across multiple studies, ranging from 35-50% reduced risk.  The Mediterranean diet is considered to be a heart-healthy diet as well.  Interested in trying the Mediterranean diet? These tips will help you get started:  ? Eat more fruits and vegetables. Aim for 7 to 10 servings a day of fruit and vegetables.  ? Opt for whole grains. Switch to whole-grain bread, cereal and pasta. Experiment with other whole grains, such as bulgur and farro.  ? Use healthy fats. Try olive oil as a replacement for butter when cooking. Instead of putting butter or margarine on bread, try dipping it in flavored olive oil.  ? Eat more seafood. Eat fish twice a week. Fresh or water-packed tuna, salmon, trout, mackerel and herring are healthy choices. Grilled fish tastes good and requires little cleanup. Avoid deep-fried fish.  ? Reduce red meat. Substitute fish, poultry or beans for meat. If you eat meat, make sure it's lean and keep portions small.  ? Enjoy some dairy. Eat low-fat [place] or plain yogurt and small amounts of a variety of cheeses.  ? Spice it up. Herbs and spices boost flavor and lessen the need for salt.    Utilize a high rated book  on the subject or a good Solicitor as well: https://www.helpguide.org/articles/diets/the-mediterranean-diet.htm    Time spent: 40 minutes with 35 minutes spent in counseling with discussion of test results, coordination of care, and the next steps.

## 2019-10-14 NOTE — Patient Instructions
Assessment/plan:      ICD-9-CM ICD-10-CM    1. Late onset Alzheimer's disease without behavioral disturbance (HCC)  331.0 G30.1     294.10 F02.80    2. Obstructive sleep apnea syndrome  327.23 G47.33    3. 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 White matter abnormality on MRI of brain  793.0 R90.82    5. Hypercholesterolemia  272.0 E78.00    6. Morning headache  784.0 R51.9         Medication:  She should continue donepezil in the morning at 10 mg and Namenda twice daily.  We could consider increasing the donepezil to twice daily dosing to see if it helps further at the follow up visit but with many sleep variables changing below I would like to focus on those first.    Sleep:  She has severe sleep apnea with an AHI of 42 and is going to get fitted for her CPAP machine next Tuesday. I strongly encouraged her to try her best to find something that will work for her and to use her CPAP machine regularly. This can help with her morning headaches, the spikes in blood pressure throughout the night, and her overall cognition.     For difficulties getting to sleep, sleep apnea machines likely will not help with that problem. Therefore I would recommend starting melatonin. Melatonin titration should be over at least 4-6 weeks before ruling it unsuccessful. A fast titration would look as follows:    1st 5 nights: 3 mg tablets, 1 tablet each night at least 1 hour before desired bedtime.  2nd 5 nights: Increase to 6 mg  3rd 5 nights: Increase to 9 mg  4th 5 nights: Increase to 12 mg  5th 5 nights: Increase to 15 mg     Most patients respond to 3-9 mg but if you feel like there are still issues with initiating or staying asleep I would recommend increasing by 3 mg as above. You can always pause for longer than 5 nights to see if a certain dose is adequate. If melatonin is not enough then I would recommend adding a stronger prescription medication that is not harmful to memory and thinking called trazodone.  They can be in touch with me over the next few weeks to months of how she is doing with sleep and we can consider adding this before her follow-up appointment.    Driving:  I am concerned with driving in this situation. The bedside cognitive testing indicates a score where driving should likely be limited or eliminated entirely. There is no clear cut off on this test of when it is not safe to drive. However, symptoms will get progressively worse with time and it is not a matter of IF but WHEN it is time to stop driving. We want to maximize independence but as safely as possible. The recommendation therefore, is to pursue an on the road driving evaluation that would help determine if safe for certain areas of driving, certain times of day, or if it is not safe to drive. This is the gold standard for trying to determine when it is no longer safe. Also, from a liability standpoint, there are legal/monetary reasons to consider not driving when it is unclear if it is safe to do so. Passing a driving evaluation would help from a liability standpoint as well. They are understanding of this reasoning. If they wanted to do a driving  evaluation I would recommend contacting Lysle Dingwall at:  [employer] [place]:  [phone]  [address]   [place], [place] [address]    Follow-up in 6 months: My main role in this clinic is to try to establish a clear diagnosis and a plan in place moving forwards.  We take a team approach at the [facility] and this involves working with our cognitive care network team and our nurse practitioners very closely.  Because of my focus on trying to see new patients to achieve accurate diagnoses, my availability for follow-ups is limited.  Therefore, now that we have helped establish a diagnosis and have a plan in place, the 72-month follow-up will be with one of our excellent nurse practitioners. I am still readily available for questions and will be actively involved in their care.      How to Keep Your Brain Healthy 1. Physical exercise: Physical exercise allows more blood flow to get to your brain.  All exercise is better than none, but gardening and walking are like tier 1 of exercise and will not get your heart rate higher than 100 in many instances. Weight training, yoga, and resistance bands are like tier 2 level. We see the real benefits at tier 3 level of exercise, which involves aerobic or cardiovascular exercise. Cardio exercise involves getting your heart rate up and a real sweat going.  This level of exercise can release BDNF (brain derived neurotrophic factor) which helps stabilize neurons - we think of it as fertilizer for those brain cells.  Research studies suggest symptoms progress slower and brain atrophy slows in cardio intensive exercise groups.  This can be done with high intensity workouts, running, biking, stationary biking, rowing machine, or in a swimming pool.  Talk to your health care provider before doing strenuous exercise. The ultimate goal is to get 150 minutes of moderate to intense exercise each week, most easily divided up into 30 minutes per day 5 days per week. It is recommended that one eases into this and slowly increases their amount of exercise over weeks to months. If you have never exercised before or are in poor exercise shape, it is recommended that you seek a personal trainer or join a local gym with programs directed for your age/exercise level.    2. Diet: Eating a heart-healthy diet may help protect brain function. Further information about the Mediterranean/MIND diet is listed below.  Talk to your primary medical provider about your specific diet needs. 3. Sleep: The toxic proteins that we all build up during the day and are associated with neurodegeneration of neurons are likely only cleared during deep, slow wave stages of sleep. If deep sleep is disrupted, fewer proteins are cleared. Sleep is also important for memory consolidation and restorative aspects to help with attention of tasks throughout the next day.  The average person requires 7-9 hours of quality sleep per night.  Try to maintain a sleep routine.  Avoid caffeine in the afternoon and heavy meals after 7 pm.  Talk to your local medical provider if you are having problems with sleeping (for example: insomnia, obstructive sleep apnea, excessive daytime sleepiness, or not sleeping enough).    4. Mental exercise: Push your brain with new things and new places.  Take a community education class, or attend a lecture at [employer]. Doing activities, such as reading, learning an instrument or a new language is healthy for our brains. Utilize cognitive training apps or programs.  There is no evidence currently that one works better than other, but make sure not to focus on one thing (crosswords, Sudoku puzzles, computer-based games, etc.)  Make sure to have a broad cognitive program.    5. Stress Reduction: Simplify your life as much as possible. Meditate, do yoga or tai chi, or perform deep breathing, etc.  Stress will amplify any underlying problem.      6. Spiritual Fitness: Find something you are passionate about and that gives your life meaning and purpose each day.  Get involved.  Stay active in your local community and with social functions.    7. Getting involved in research: This can be empowering for many who are involved in research. It gives individuals a sense of purpose and fighting back against this disease. The first person who will be cured of a neurodegenerative disease will be from a research study and helping [place] move that science forward is a major contribution.    Mediterranean Diet The Mediterranean Diet and the MIND diet are the best studied across neurodegenerative diseases.  Both have shown a reduced risk of Alzheimer's disease across multiple studies, ranging from 35-50% reduced risk.  The Mediterranean diet is considered to be a heart-healthy diet as well.  Interested in trying the Mediterranean diet? These tips will help you get started:  ? Eat more fruits and vegetables. Aim for 7 to 10 servings a day of fruit and vegetables.  ? Opt for whole grains. Switch to whole-grain bread, cereal and pasta. Experiment with other whole grains, such as bulgur and farro.  ? Use healthy fats. Try olive oil as a replacement for butter when cooking. Instead of putting butter or margarine on bread, try dipping it in flavored olive oil.  ? Eat more seafood. Eat fish twice a week. Fresh or water-packed tuna, salmon, trout, mackerel and herring are healthy choices. Grilled fish tastes good and requires little cleanup. Avoid deep-fried fish.  ? Reduce red meat. Substitute fish, poultry or beans for meat. If you eat meat, make sure it's lean and keep portions small.  ? Enjoy some dairy. Eat low-fat [place] or plain yogurt and small amounts of a variety of cheeses.  ? Spice it up. Herbs and spices boost flavor and lessen the need for salt.    Utilize a high rated book on the subject or a good Solicitor as well: https://www.helpguide.org/articles/diets/the-mediterranean-diet.htm

## 2020-01-03 ENCOUNTER — Encounter: Admit: 2020-01-03 | Discharge: 2020-01-03 | Payer: MEDICARE

## 2020-01-03 MED ORDER — MEMANTINE 10 MG PO TAB
10 mg | ORAL_TABLET | Freq: Two times a day (BID) | ORAL | 1 refills | Status: AC
Start: 2020-01-03 — End: ?

## 2020-01-03 NOTE — Telephone Encounter
Per last visit with Dr. Valetta Close on 10/14/19, the patient is to continue memantine 10 mg BID.  Last Rx sent 10/04/19 #180 with 0 refills.

## 2020-01-09 ENCOUNTER — Encounter: Admit: 2020-01-09 | Discharge: 2020-01-09 | Payer: MEDICARE

## 2020-01-27 MED ORDER — DONEPEZIL 10 MG PO TAB
ORAL_TABLET | ORAL | 1 refills | 90.00000 days | Status: DC
Start: 2020-01-27 — End: 2020-04-04

## 2020-01-28 ENCOUNTER — Encounter: Admit: 2020-01-28 | Discharge: 2020-01-28 | Payer: MEDICARE

## 2020-01-29 ENCOUNTER — Encounter: Admit: 2020-01-29 | Discharge: 2020-01-29 | Payer: MEDICARE

## 2020-04-04 ENCOUNTER — Encounter: Admit: 2020-04-04 | Discharge: 2020-04-04 | Payer: MEDICARE

## 2020-04-04 MED ORDER — DONEPEZIL 10 MG PO TAB
10 mg | ORAL_TABLET | Freq: Two times a day (BID) | ORAL | 0 refills | 90.00000 days | Status: AC
Start: 2020-04-04 — End: ?

## 2020-04-18 ENCOUNTER — Ambulatory Visit: Admit: 2020-04-18 | Discharge: 2020-04-18 | Payer: MEDICARE

## 2020-04-18 ENCOUNTER — Encounter: Admit: 2020-04-18 | Discharge: 2020-04-18 | Payer: MEDICARE

## 2020-04-18 DIAGNOSIS — G4733 Obstructive sleep apnea (adult) (pediatric): Secondary | ICD-10-CM

## 2020-04-18 DIAGNOSIS — I6381 Other cerebral infarction due to occlusion or stenosis of small artery: Secondary | ICD-10-CM

## 2020-04-18 DIAGNOSIS — G301 Alzheimer's disease with late onset: Secondary | ICD-10-CM

## 2020-04-18 NOTE — Patient Instructions
CARE PLAN  Further Evaluation  ? At next follow up, arrange for in person for neurocog and physical exam   ? Increase calorie/fat/protein by adding at least 1 serving of full fat dairy (whole milk or yogurt) daily. Make sure snacks are available in clear view and easy to access. If wt does not level off or improve with this, check with PCP about other causes of weight loss. If PCP cannot find cause, we can consider weaning down the donepezil from BID to daily dosing as a trial     Cognitive therapy plan  ? Continue Aricept (donepezil) 10mg  by mouth twice daily-see above  ? Continue Namenda (memantine) 10mg  by mouth twice a day.  ? Risks and benefits of sleep apnea discussed-she is not interested in pursuing further work up or treatment    Neuropsychiatric therapy  ? Continue Lexapro (escitalopram) 20mg  by mouth daily for mood    Lifestyle Recommendations  ? Encouraged to stay active mentally, physically, and socially  ? Encouraged heart healthy diet  ? My Alliance: [email or web address]    Referrals  ? Cognitive Care Network:  Good family support  ? Research:  Defer due to driving location  ? General educational materials provided    Follow-up  ? Return to clinic in 6 months, in person, with Dr. Valetta Close.

## 2020-04-18 NOTE — Progress Notes
Obtained patient's verbal consent to treat them and their agreement to [facility] financial policy and NPP via this telehealth visit during the [facility]  This visit was conducted via audio and visual      MEMORY CARE CLINIC EVALUATION    Chief Complaint:  Sabrina Dillon is a 79 y.o. old female here for a follow-up cognitive evaluation.   Additional history provided by:   Her daughter  Onset:  2017  Course:  progressive    HPI:  Since last visit on 09/2019 via telephone only, with Dr. Valetta Close her memory has been stable.    Continues to have some word finding. She has not had any change in IADL or ADL function. [place] that [place] staff available 24hr/day (no meals offered). She is wanting more social interaction    She is not wearing her CPAP, and has not for approx 3 months. She was having pain with the fit, not sleeping well, and then developed an infection to the R side of her face. They are unsure what caused the infection, but she is not interested in repeating a sleep test to get another machine (insurance is requiring this)    Recent changes in health or hospitalizations? 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 Denies heavy alcohol and illicit substance use.    Function  Living Situation: lives alone  Dressing: Independent  Bathing: Independent  Toileting: Independent  Meal Preparation: assistance, gets a meal delivered at lunch.   Eating: Independent  Medication administration: assistance-uses a timed pill box  Usual household chores: Independent  Managing finances: dependent  Activity:goes for rides with family, enjoys watching ball game on TV    Behavioral and Neuropsychiatric History (i.e., depression, agitation, behavior / personality): PHQ-2 Score: 0 (04/18/2020  3:35 PM)    Depression: No Anxiety: No  Agitation/Behavior/Personality changes: No  Visual hallucinations: No      ROS:  Parkinsonism / tremors: No  Seizures: No  Gait Changes:  A little unsteady at times, walks slower  Falls: No  Vision/Hearing changes: No  Speech changes: No  Unintentional Wt loss: yes, 7lbs in 6 months. Eats well at family meals, sometime does not like food delivered or c/o not feeling hungry  Swallowing difficulties: No  Sleep issues: Yes, OSA,  She had her sleep study and had an AHI of 42 per hour. Her blood pressure and heart rate were increased. The mask was painful and difficult to keep on, so she does not wear it anymore, has not for approx 3 months  B/B Incontinence: No    Medical History:   Diagnosis Date   ? Disorganized thinking    ? Memory loss        No family history on file.      Caregiver Burden and Social Support:  Lenzy Linenberger lives alone. Burtis Junes does partially rely on care giving.  Caregiver Burden expressed? No      Safety Assessment:  Driving? No  Safety concerns in home? No  Wandering? No  Living Alone? Yes    Advanced Care Planning  DPOA:  No  Advance Directive: No    Medications:  ? aspirin 81 mg chewable tablet Chew 81 mg by mouth daily. Take with food.   ? atorvastatin (  LIPITOR) 40 mg tablet Take 40 mg by mouth daily.   ? cetirizine (ZYRTEC) 10 mg tablet Take 10 mg by mouth every morning.   ? donepeziL (ARICEPT) 10 mg tablet Take one tablet by mouth twice daily. (Patient taking differently: Take 20 mg by mouth twice daily.)   ? escitalopram oxalate (LEXAPRO) 20 mg tablet Take 20 mg by mouth daily.   ? fluticasone propionate (FLONASE) 50 mcg/actuation nasal spray, suspension Apply  to each nostril as directed daily. Shake bottle gently before using.   ? memantine (NAMENDA) 10 mg tablet Take one tablet by mouth twice daily.       Medication Review:   I have personally reviewed the medication list and am making recommendations as listed in the plan below.   Family manages medication at home for [name].   The following medications have been identified as potential high risk medications: none    Labs/Imaging:  No results      Physical/Cognitive Exam  Telehealth Patient Reported Vitals     Row Name 04/18/20 1616 04/18/20 1528             BP:  (!) 146/81  ?       BP Source:  Arm, Left Upper  ?       BP Position:  Sitting  ?       Weight:  51.3 kg (113 lb)  ?       Height:  ?  160 cm (63)       Pain Score:  ?  Zero               BP Readings from Last 3 Encounters:   08/02/18 160/82     Wt Readings from Last 6 Encounters:   04/18/19 57.6 kg (127 lb)   08/02/18 55.8 kg (123 lb)       General  Groomed with appropriate hygiene. Dress appropriate for season/situation. No assist device    Cardio/Pulm  Non labored at rest, good effort    Mental Status  Alert, orient to self, situation  Language fluent    Cranial Nerves  Extraocular movements intact  No facial asymmetry  Shoulder shrug R >L    Motor  Full strength in the upper and lower extremities-AROM  Rigidity: none  Bradykinesia:none   Tremor: none    Coordination  Finger to nose testing normal    Gait  Rising from chair: Normal - without pushing off  Posture: Normal  Gait initiation: Normal  Stride Length: Reduced stride length  Arm Swing: Normal  Turns: Difficulty with turns - mild imbalance    Psych  Affect and mood congruent and appropriate to situation  Thought process is logical  Jovial and witty    Neurobehavioral Testing:    09/2019 STMS 26/34   10/ 2019 STMS 26/38.      Diagnosis and Plan       1. Late onset Alzheimer's disease without behavioral disturbance (HCC)     2. Obstructive sleep apnea syndrome     3. 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   Patient does not appear to have remarkable difficulties with her medical decision-making capacity.    CARE PLAN  Further Evaluation  ? At next follow up, arrange for in person for neurocog and physical exam   ? Increase calorie/fat/protein by adding at least 1 serving of  full fat dairy (whole milk or yogurt) daily. Make sure snacks are available in clear view and easy to access. If wt does not level off or improve with this, check with PCP about other causes of weight loss. If PCP cannot find cause, we can consider weaning down the donepezil from BID to daily dosing as a trial     Cognitive therapy plan  ? Continue Aricept (donepezil) 10mg  by mouth twice daily-see above  ? Continue Namenda (memantine) 10mg  by mouth twice a day.  ? Risks and benefits of sleep apnea discussed-she is not interested in pursuing further work up or treatment    Neuropsychiatric therapy  ? Continue Lexapro (escitalopram) 20mg  by mouth daily for mood    Lifestyle Recommendations  ? Encouraged to stay active mentally, physically, and socially  ? Encouraged heart healthy diet  ? My Alliance: [email or web address]    Referrals  ? Cognitive Care Network:  Good family support  ? Research:  Defer due to driving location  ? General educational materials provided    Follow-up  ? Return to clinic in 4-6 months.    Total of 40 minutes were spent on the same day of the visit including preparing to see the patient, obtaining and/or reviewing separately obtained history, performing a medically appropriate examination and/or evaluation, counseling and educating the patient/family/caregiver, ordering medications, tests, or procedures, referring and communication with other health care professionals, documenting clinical information in the electronic or other health record, independently interpreting results and communicating results to the patient/family/caregiver, and care coordination.

## 2020-04-19 ENCOUNTER — Encounter: Admit: 2020-04-19 | Discharge: 2020-04-19 | Payer: MEDICARE

## 2020-07-04 ENCOUNTER — Encounter: Admit: 2020-07-04 | Discharge: 2020-07-04 | Payer: MEDICARE

## 2020-07-04 MED ORDER — DONEPEZIL 10 MG PO TAB
10 mg | ORAL_TABLET | Freq: Two times a day (BID) | ORAL | 0 refills
Start: 2020-07-04 — End: ?

## 2020-07-04 NOTE — Telephone Encounter
Per the Care Plan from the patient's last visit with Judy Pimple, APRN-NP on 6.23.21, the patient is to continue Memantine 10 mg PO BID/ .  Last Rx written on 3.9.21 for 180 with 1 refills.  Next visit scheduled for 12.27.21.    Per the Care Plan from the patient's last visit with Judy Pimple, APRN-NP on 6.23.21, the patient is to continue Donepezil 10 mg PO BID/ .  Last Rx written on 6.9.21 for 180 with 0 refills.  Next visit scheduled for 12.27.21.

## 2020-10-11 ENCOUNTER — Encounter: Admit: 2020-10-11 | Discharge: 2020-10-11 | Payer: MEDICARE

## 2020-10-11 MED ORDER — DONEPEZIL 10 MG PO TAB
10 mg | ORAL_TABLET | Freq: Two times a day (BID) | ORAL | 1 refills | 90.00000 days | Status: AC
Start: 2020-10-11 — End: ?

## 2020-10-11 NOTE — Telephone Encounter
Per the Care Plan from the patient's last visit with Dr. Farris Has on 04/18/2020, the patient is to continue Donepezil 10 mg PO BID/ .  Last Rx written on 07/15/2020 for 90 with 0 refills.  Next visit scheduled for 10/12/2020.

## 2020-10-22 ENCOUNTER — Encounter: Admit: 2020-10-22 | Discharge: 2020-10-22 | Payer: MEDICARE | Primary: Primary Care

## 2020-10-22 DIAGNOSIS — R413 Other amnesia: Secondary | ICD-10-CM

## 2020-10-22 DIAGNOSIS — R4189 Other symptoms and signs involving cognitive functions and awareness: Secondary | ICD-10-CM

## 2020-10-23 ENCOUNTER — Encounter: Admit: 2020-10-23 | Discharge: 2020-10-23 | Payer: MEDICARE | Primary: Primary Care

## 2020-10-23 DIAGNOSIS — R413 Other amnesia: Secondary | ICD-10-CM

## 2020-10-23 DIAGNOSIS — R4189 Other symptoms and signs involving cognitive functions and awareness: Secondary | ICD-10-CM

## 2020-11-15 ENCOUNTER — Encounter: Admit: 2020-11-15 | Discharge: 2020-11-15 | Payer: MEDICARE | Primary: Primary Care

## 2020-12-31 ENCOUNTER — Encounter: Admit: 2020-12-31 | Discharge: 2020-12-31 | Payer: MEDICARE | Primary: Primary Care

## 2020-12-31 MED ORDER — MEMANTINE 10 MG PO TAB
10 mg | ORAL_TABLET | Freq: Two times a day (BID) | ORAL | 2 refills | Status: AC
Start: 2020-12-31 — End: ?

## 2020-12-31 NOTE — Telephone Encounter
Last visit with Dr. Valetta Close was 12.27.21.  Plan said Continue Namenda (memantine) 10mg  by mouth twice a day. Next visit is 6.28.22.  Last ordered 9.9.21 #180 refills 1.

## 2021-04-09 ENCOUNTER — Encounter: Admit: 2021-04-09 | Discharge: 2021-04-09 | Payer: MEDICARE | Primary: Primary Care

## 2021-04-09 MED ORDER — DONEPEZIL 10 MG PO TAB
10 mg | ORAL_TABLET | Freq: Two times a day (BID) | ORAL | 3 refills | 90.00000 days | Status: AC
Start: 2021-04-09 — End: ?

## 2021-04-09 NOTE — Telephone Encounter
Last visit was 10/22/2020 Per care plan: Continue Aricept (donepezil) 10mg  by mouth twice daily-see above  Continue Namenda (memantine) 10mg  by mouth twice a day. Next visit is 04/23/2021.  Escribed 180 pills and 3 refills to [facility].

## 2021-04-12 ENCOUNTER — Encounter: Admit: 2021-04-12 | Discharge: 2021-04-12 | Payer: MEDICARE | Primary: Primary Care

## 2021-04-12 NOTE — Telephone Encounter
Returned call to [place] FNP at [facility]    In the past month, she's had a cataract removed. Since this procedure, family has noticed increased confusion. Upon further discussion, Sabrina Dillon is seeing people in her kitchen. She can see and hear them. They hallucinations do talk to her. This is frightening for her. She's not sleeping well at night. She's having day/night disorientation.     UA was negative, CMP and CBC are pending.     Agreed to trial 25mg  seroquel at night to treat psychosis-hallucinations. Will follow up in 10 days at our clinic visit

## 2021-04-12 NOTE — Telephone Encounter
Archie Patten from [facility] called in and stated they saw this patient today in their clinic.  She reported the patient is having problems sleeping, and having hallucinations. Nurse practitioner Terri Piedra wants to prescribe seroquel for the patient but wanted to check with Dr. Valetta Close and  Pimple, APRN first before prescribing it. This nurse told Archie Patten that she will check with them and call her back. She verbalized understanding.

## 2021-04-22 NOTE — Progress Notes
MEMORY CARE CLINIC EVALUATION    Chief Complaint:  Sabrina Dillon is a 80 y.o. old female here for a follow-up cognitive evaluation.   Additional history provided by:   Her two daughters  Onset:  2017  Course:  progressive    HPI:  Since last visit on 09/2020, with Dr. Valetta Close her cognition has declined.    Recently started on seroquel by her PCP after metabolic/infectious work up was negative. Sabrina Dillon was having both visual and auditory hallcinations of people They were frightening and distressing for her. Her sleep was disrupted and leading to some day/night disorientation. Her PCP started her on seroquel 25mg  PO qhs. She was also started on oxybuytnin by her urologist after the cost of myrbetriq increased, and started on B12 IM replacement monthly. We do not have her recent B12 levels.     She also recently had cataracts removed. She was given xanax during the procedure and delirum followed. She was sleeping more, hallucinating more often and increased confusion for approx 1 week. She used a walker for several days as well due to gait instability.     Cognitively, she is misplacing items more. She is often hiding things, worried about theft, and then cannot find them. She naps in the morning and early evening, often with food and drink on her that spills. No injuries from this. She has worsening short term recall.    Recently prescribed oxybutynin from her urologist at [facility] in [place]. Had previously been in [place], but this became cost prohibitive.     Recent changes in health or hospitalizations? No    Function  [place]   Eating: Independent  Toileting: Independent  Bathing: Independent  Dressing: Independent  Meal Preparation: needs assistance-family delivers dinner. Lunch provided. Can make simple frozen meals  Usual household chores: Independent  Medication administration: dependent-automatic pill dispenser  Managing finances: dependent  Activity: household chores, socializes with neighbors, exercise class daily (chair strength training)     Behavioral and Neuropsychiatric History (i.e., depression, agitation, behavior / personality): PHQ-2 Score: 0 (04/23/2021 10:00 AM)  Depression: currently well controlled on lexapro. Gets down easier than in the past, but feels well supported by her family  Anxiety: No  Agitation/Behavior/Personality changes: hides her purse in her apt, sets a barrier in front of her door at night.  Visual hallucinations: yes, can see people in her apt.    ROS:  Parkinsonism / tremors: No  Gait Changes: No  Falls: tripped on furniture. Fell back onto her buttock  Vision/Hearing changes: cataract removed 02/2021  Speech changes: word finding  Unintentional Wt loss: No  Swallowing difficulties: No  Sleep issues: OSA, not using CPAP She had her sleep study and had an AHI of 42 per hour. Naps often during the day  B/B Incontinence: bladder urgency and overnight occasional incontinence    Medical History:   Diagnosis Date   ? Disorganized thinking    ? Memory loss        No family history on file.    Social History     Socioeconomic History   ? Marital status: Widowed   Tobacco Use   ? Smoking status: Never Smoker   ? Smokeless tobacco: Never Used   Substance and Sexual Activity   ? Alcohol use: Never   ? Drug use: Never       Care Partner Burden and Social Support:  Sabrina Dillon lives in [place]. Sabrina Dillon does partially rely  on care giving.  Care partner burden expressed? No    Safety Assessment:  Driving? No  Safety concerns in home? Yes hallucinations  Wandering? No    Advanced Care Planning  DPOA:  No   Advance Directive: No    Medications:  ? aspirin 81 mg chewable tablet Chew 81 mg by mouth daily. Take with food.   ? atorvastatin (LIPITOR) 40 mg tablet Take 40 mg by mouth daily.   ? cetirizine (ZYRTEC) 10 mg tablet Take 10 mg by mouth every morning.   ? cyanocobalamin (VITAMIN B-12) 100 mcg tablet Take 100 mcg by mouth daily.   ? donepeziL (ARICEPT) 10 mg tablet Take one tablet by mouth twice daily.   ? escitalopram oxalate (LEXAPRO) 20 mg tablet Take 20 mg by mouth daily.   ? fluticasone propionate (FLONASE) 50 mcg/actuation nasal spray, suspension Apply 2 sprays to each nostril as directed twice daily. Shake bottle gently before using.    ? lisinopril (ZESTRIL) 10 mg tablet    ? memantine (NAMENDA) 10 mg tablet Take one tablet by mouth twice daily.   ? mirabegron (MYRBETRIQ) 50 mg ER tablet Take 50 mg by mouth daily.   ? oxybutynin chloride (DITROPAN) 5 mg tablet    ? quetiapine fumarate (SEROQUEL PO) Take  by mouth.       Medication Review:   I have personally reviewed the medication list and am making recommendations as listed in the plan below.   Family manages medication at home for Sabrina Dillon.   The following medications have been identified as potential high risk medications: quetiapine, oxybutynin    Labs/Imaging:  2019 Brain MRI        'Physical/Cognitive Exam  BP (!) 158/76  - Pulse 67  - Temp 36.4 ?C (97.6 ?F)  - Resp 13  - Ht 165.1 cm (5' 5)  - Wt 56.7 kg (125 lb)  - SpO2 98%  - BMI 20.80 kg/m?   BP Readings from Last 3 Encounters:   04/23/21 (!) 158/76   10/22/20 (!) 161/77   08/02/18 160/82     Wt Readings from Last 6 Encounters:   04/23/21 56.7 kg (125 lb)   10/22/20 56.1 kg (123 lb 9.6 oz)   04/18/19 57.6 kg (127 lb)   08/02/18 55.8 kg (123 lb)       General  Groomed with appropriate hygiene. Dress appropriate for season/situation. No assist device    HEENT  NC/AT    Cardio/Pulm  Non labored at rest, good effort  RR to radial palpation    Mental Status  Alert, scored 5/8 for orientation on STMS  Language circumlocution    Cranial Nerves  Extraocular movements intact  Pupils equal and reactive to light  No facial asymmetry  Shoulder shrug symmetrical  Tongue and palate midline-not assessed due to COVID precautions requiring mask  All other CNs normal    Motor  Full strength in the upper and lower extremities  Rigidity: none  Bradykinesia: none   Tremor: none      Coordination  Finger to nose testing no apraxia  Finger taps WNL    Gait  Rising from chair: Normal - without pushing off  Posture: Mild kyphosis - long standing issue  Gait initiation: Normal  Stride Length: Normal  Arm Swing: Normal  Turns: Normal      Psych  Affect and mood congruent and appropriate to situation  Thought process-poor insight      Neurobehavioral Testing:  10/ 2019 STMS 26/38.  09/2019 STMS 26/34?-this was over the phone and we could not do cube and clock.  09/2020 STMS 24/38    Short Test of Mental Status  Orientation: 5/8  Attention: 6/7  Immediate Recall: 4/4 (number of trials: 1, -0)  Calculations: 0/4  Abstraction: 2/3  Cube: 0/2  Clock: 0/2    Information: 0/4  Delayed Recall: 0/4  Total Score: 17/38       Diagnosis and Plan       1. 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 Obstructive sleep apnea syndrome         FAST score: 4. Mild Dementia-IADL's become affected, such as bill paying, cooking, cleaning, traveling    Decision Making  Patient has decision-making capacity for simpler decisions, but will need assistance with more complex situations.    CARE PLAN  Further Evaluation  ? Consider a door chime, a bell on the door or chain lock on the door.  ? I will request records of your recent labs from your primary care including your Vitamin B12 level. Ideally, we would keep this >400.  ? Delirium information provided  ? Message left with urologist to discuss transition to trospium rather than oxybuytnin     Cognitive therapy plan  ? Continue Aricept (donepezil) 10 mg by mouth daily with food.  ? Continue Namenda (memantine) 10 mg by mouth, once in the morning and once at night  ? Risks and benefits of sleep apnea discussed previously.     Neuropsychiatric therapy  ? Continue Lexapro (escitalopram) 20mg  by mouth daily for mood  Ok to continue quetiapine for hallucinations given previous impact on quality of life, paranoia and fear. Discussed black box warning    Lifestyle Recommendations  ? Encouraged to stay active mentally, physically, and socially  ? Encouraged heart healthy diet  ? Driving: Do not drive    ? Advised to limit alcohol intake   ? My Alliance: [email or web address]    Referrals  ? Cognitive Care Network:  Deferred today, has adequate support   ? Research:  Not appropriate due to advanced stage of illness and/or inability to cooperate with study requirements and/or geographic limitations  ? General educational materials provided on lifestyle interventions for memory care    Follow-up  ? Return to clinic in 6 months.      Other important information:  Scheduling: [phone]   Judy Pimple or our nurses: [phone]  I am always available via mychart. Please feel free to send me any questions you might have, anytime.  After hours, you can call [phone], and have the neurology resident on call paged

## 2021-04-23 ENCOUNTER — Ambulatory Visit: Admit: 2021-04-23 | Discharge: 2021-04-23 | Payer: MEDICARE | Primary: Primary Care

## 2021-04-23 ENCOUNTER — Encounter: Admit: 2021-04-23 | Discharge: 2021-04-23 | Payer: MEDICARE | Primary: Primary Care

## 2021-04-23 DIAGNOSIS — G4733 Obstructive sleep apnea (adult) (pediatric): Secondary | ICD-10-CM

## 2021-04-23 DIAGNOSIS — R413 Other amnesia: Secondary | ICD-10-CM

## 2021-04-23 DIAGNOSIS — R4189 Other symptoms and signs involving cognitive functions and awareness: Secondary | ICD-10-CM

## 2021-04-23 DIAGNOSIS — F0390 Unspecified dementia without behavioral disturbance: Secondary | ICD-10-CM

## 2021-04-23 NOTE — Progress Notes
Received recent lab results from PCP.  Put a copy in the  Pimple, APRN's file for review and a copy in the scan file to be scanned.

## 2021-04-23 NOTE — Telephone Encounter
This nurse called the patient's PCP and requested the most recent lab results to be sent to our office. Gave our fax number of [phone].  The medical records staff stated she would fax them today.

## 2021-04-24 ENCOUNTER — Encounter: Admit: 2021-04-24 | Discharge: 2021-04-24 | Payer: MEDICARE | Primary: Primary Care

## 2021-04-24 NOTE — Telephone Encounter
Call received from [facility], insurance is not going to cover Sanctura(Trospium). provider wants LGillen's thoughts on Toviaz.  LGillen messaged regarding this.

## 2021-04-24 NOTE — Telephone Encounter
This nurse received f/u phone from [facility] Lorelle Formosa) call regarding Oxybutynin.  Will D/C Oxybutynin and  change to Trospium 60 mg every day.  Lorelle Formosa states they have talked with SCollins about this.

## 2021-04-25 ENCOUNTER — Encounter: Admit: 2021-04-25 | Discharge: 2021-04-25 | Payer: MEDICARE | Primary: Primary Care

## 2021-07-21 ENCOUNTER — Encounter: Admit: 2021-07-21 | Discharge: 2021-07-21 | Payer: MEDICARE | Primary: Primary Care

## 2021-09-30 ENCOUNTER — Encounter: Admit: 2021-09-30 | Discharge: 2021-09-30 | Payer: MEDICARE | Primary: Primary Care

## 2021-10-07 ENCOUNTER — Encounter: Admit: 2021-10-07 | Discharge: 2021-10-07 | Payer: MEDICARE | Primary: Primary Care

## 2021-10-07 MED ORDER — MEMANTINE 10 MG PO TAB
ORAL_TABLET | Freq: Two times a day (BID) | 3 refills | Status: AC
Start: 2021-10-07 — End: ?

## 2021-10-07 NOTE — Telephone Encounter
Last visit was 04/23/2021 and next visit is 11/05/2021.  Per care plan: Continue Namenda (memantine) 10 mg by mouth, once in the morning and once at night.  Escribed 180 pills and 3 refills.

## 2021-11-01 ENCOUNTER — Encounter: Admit: 2021-11-01 | Discharge: 2021-11-01 | Payer: MEDICARE | Primary: Primary Care

## 2021-11-01 NOTE — Telephone Encounter
This nurse called Sabrina Dillon at [phone] and talked to nurse Sabrina Dillon.  Med orders sent by PCP, did not have most recent Seroquel dose change.  This nurse successfully faxed last order from 07/22/21 to [facility] at fax [phone].

## 2021-11-05 ENCOUNTER — Encounter: Admit: 2021-11-05 | Discharge: 2021-11-05 | Payer: MEDICARE | Primary: Primary Care

## 2021-11-05 DIAGNOSIS — G309 Alzheimer's disease, unspecified: Secondary | ICD-10-CM

## 2021-11-05 DIAGNOSIS — R4189 Other symptoms and signs involving cognitive functions and awareness: Secondary | ICD-10-CM

## 2021-11-05 DIAGNOSIS — R413 Other amnesia: Secondary | ICD-10-CM

## 2021-11-06 ENCOUNTER — Encounter: Admit: 2021-11-06 | Discharge: 2021-11-06 | Payer: MEDICARE | Primary: Primary Care

## 2021-11-06 DIAGNOSIS — R4189 Other symptoms and signs involving cognitive functions and awareness: Secondary | ICD-10-CM

## 2021-11-06 DIAGNOSIS — R413 Other amnesia: Secondary | ICD-10-CM

## 2021-12-27 ENCOUNTER — Encounter: Admit: 2021-12-27 | Discharge: 2021-12-27 | Payer: MEDICARE | Primary: Primary Care

## 2021-12-27 MED ORDER — QUETIAPINE 25 MG PO TAB
ORAL_TABLET | 11 refills
Start: 2021-12-27 — End: ?

## 2022-01-21 ENCOUNTER — Encounter: Admit: 2022-01-21 | Discharge: 2022-01-21 | Payer: MEDICARE | Primary: Primary Care

## 2022-04-10 ENCOUNTER — Encounter: Admit: 2022-04-10 | Discharge: 2022-04-10 | Payer: MEDICARE | Primary: Primary Care

## 2022-06-02 ENCOUNTER — Encounter: Admit: 2022-06-02 | Discharge: 2022-06-02 | Payer: MEDICARE | Primary: Primary Care

## 2022-08-31 ENCOUNTER — Encounter: Admit: 2022-08-31 | Discharge: 2022-08-31 | Payer: MEDICARE | Primary: Primary Care

## 2022-11-20 ENCOUNTER — Encounter: Admit: 2022-11-20 | Discharge: 2022-11-20 | Payer: MEDICARE | Primary: Primary Care

## 2022-11-20 MED ORDER — QUETIAPINE 25 MG PO TAB
25 mg | ORAL_TABLET | Freq: Two times a day (BID) | ORAL | 0 refills | Status: AC
Start: 2022-11-20 — End: ?

## 2022-12-18 ENCOUNTER — Encounter: Admit: 2022-12-18 | Discharge: 2022-12-18 | Payer: MEDICARE | Primary: Primary Care

## 2022-12-18 MED ORDER — QUETIAPINE 25 MG PO TAB
ORAL_TABLET | PRN refills
Start: 2022-12-18 — End: ?

## 2022-12-29 ENCOUNTER — Encounter: Admit: 2022-12-29 | Discharge: 2022-12-29 | Payer: MEDICARE | Primary: Primary Care

## 2022-12-29 MED ORDER — QUETIAPINE 25 MG PO TAB
ORAL_TABLET | PRN refills
Start: 2022-12-29 — End: ?

## 2022-12-30 ENCOUNTER — Encounter: Admit: 2022-12-30 | Discharge: 2022-12-30 | Payer: MEDICARE | Primary: Primary Care

## 2022-12-30 MED ORDER — QUETIAPINE 25 MG PO TAB
ORAL_TABLET | 11 refills
Start: 2022-12-30 — End: ?
